# Patient Record
Sex: Female | Born: 1960 | Race: Black or African American | Hispanic: No | Marital: Married | Smoking: Never smoker
Health system: Southern US, Community
[De-identification: ages and names within clinical notes are randomized; demographics above are authoritative.]

## PROBLEM LIST (undated history)

## (undated) DIAGNOSIS — E785 Hyperlipidemia, unspecified: Secondary | ICD-10-CM

## (undated) DIAGNOSIS — I10 Essential (primary) hypertension: Secondary | ICD-10-CM

## (undated) HISTORY — DX: Hyperlipidemia, unspecified: E78.5

## (undated) HISTORY — DX: Essential (primary) hypertension: I10

---

## 2000-06-14 ENCOUNTER — Other Ambulatory Visit: Admission: RE | Admit: 2000-06-14 | Discharge: 2000-06-14 | Payer: Self-pay | Admitting: Obstetrics and Gynecology

## 2001-07-31 ENCOUNTER — Other Ambulatory Visit: Admission: RE | Admit: 2001-07-31 | Discharge: 2001-07-31 | Payer: Self-pay | Admitting: Obstetrics and Gynecology

## 2001-07-31 ENCOUNTER — Encounter: Admission: RE | Admit: 2001-07-31 | Discharge: 2001-07-31 | Payer: Self-pay | Admitting: General Practice

## 2001-07-31 ENCOUNTER — Encounter: Payer: Self-pay | Admitting: General Practice

## 2002-08-06 ENCOUNTER — Other Ambulatory Visit: Admission: RE | Admit: 2002-08-06 | Discharge: 2002-08-06 | Payer: Self-pay | Admitting: Obstetrics and Gynecology

## 2002-12-18 ENCOUNTER — Ambulatory Visit (HOSPITAL_COMMUNITY): Admission: RE | Admit: 2002-12-18 | Discharge: 2002-12-18 | Payer: Self-pay | Admitting: Urology

## 2002-12-18 ENCOUNTER — Encounter: Payer: Self-pay | Admitting: Urology

## 2003-08-19 ENCOUNTER — Other Ambulatory Visit: Admission: RE | Admit: 2003-08-19 | Discharge: 2003-08-19 | Payer: Self-pay | Admitting: Obstetrics and Gynecology

## 2003-08-27 ENCOUNTER — Ambulatory Visit (HOSPITAL_COMMUNITY): Admission: RE | Admit: 2003-08-27 | Discharge: 2003-08-27 | Payer: Self-pay | Admitting: Obstetrics and Gynecology

## 2004-06-15 ENCOUNTER — Ambulatory Visit (HOSPITAL_COMMUNITY): Admission: RE | Admit: 2004-06-15 | Discharge: 2004-06-15 | Payer: Self-pay | Admitting: Obstetrics and Gynecology

## 2004-11-08 ENCOUNTER — Other Ambulatory Visit: Admission: RE | Admit: 2004-11-08 | Discharge: 2004-11-08 | Payer: Self-pay | Admitting: Obstetrics and Gynecology

## 2004-11-09 ENCOUNTER — Ambulatory Visit (HOSPITAL_COMMUNITY): Admission: RE | Admit: 2004-11-09 | Discharge: 2004-11-09 | Payer: Self-pay | Admitting: Obstetrics and Gynecology

## 2005-11-10 ENCOUNTER — Other Ambulatory Visit: Admission: RE | Admit: 2005-11-10 | Discharge: 2005-11-10 | Payer: Self-pay | Admitting: Obstetrics and Gynecology

## 2006-03-20 IMAGING — RF DG HYSTEROGRAM
5 series · 5 of 5 positions shown · non-contrast
Comparison: none

CLINICAL DATA: Assess tubal patency.
HYSTEROGRAM:
Following sterile cleansing of the external cervical os, hysterosalpingography was performed via placement of a 5 French hysterosalpingography catheter into the endocervical canal where is was stabilized with an end catheter balloon.  A normal endometrial morphology is seen.  No extrinsic compression abnormalities from the patient?s known fibroids are seen to suggest a submucosal component.
Both fallopian tubes filled readily and demonstrated a normal appearance.  Bilateral free intraperitoneal spill was identified on both sides. On the post-drainage film no signs of loculation of contrast was seen to suggest the presence of peritubal or periovarian adhesions.

[Series 1: run · 1 of 1 slices shown (1 of 5)]
[im 1/1]
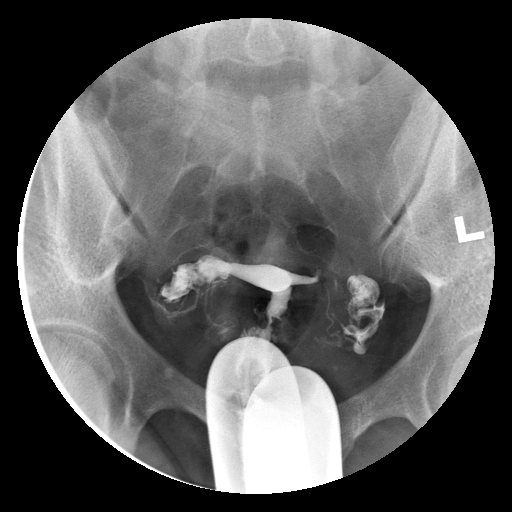

[Series 2: run · 1 of 1 slices shown (2 of 5)]
[im 1/1]
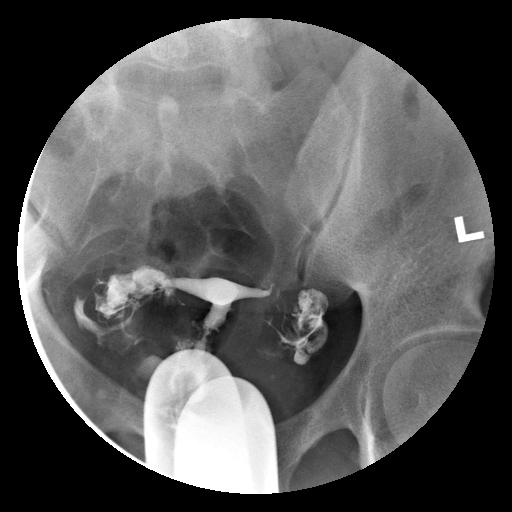

[Series 3: run · 1 of 1 slices shown (3 of 5)]
[im 1/1]
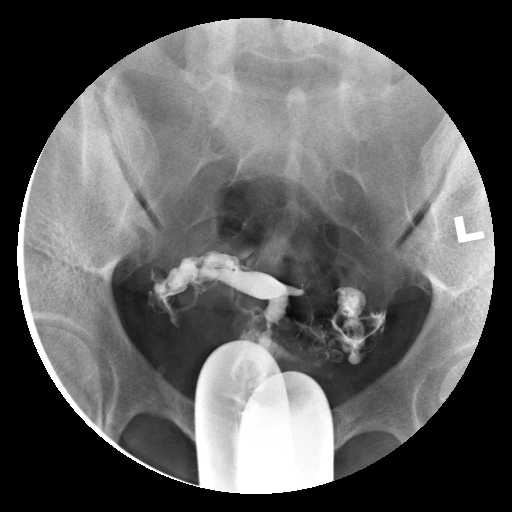

[Series 4: run · 1 of 1 slices shown (4 of 5)]
[im 1/1]
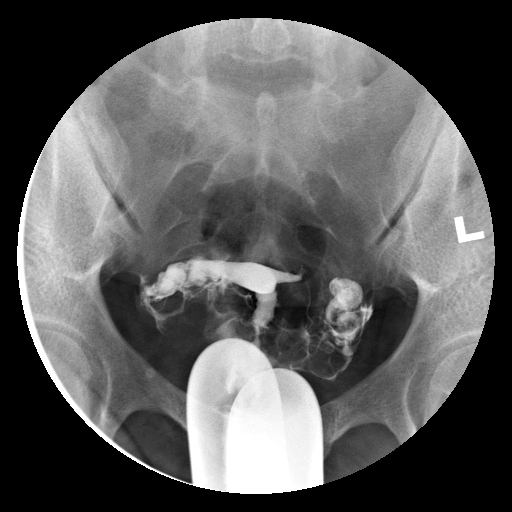

[Series 5: run · 1 of 1 slices shown (5 of 5)]
[im 1/1]
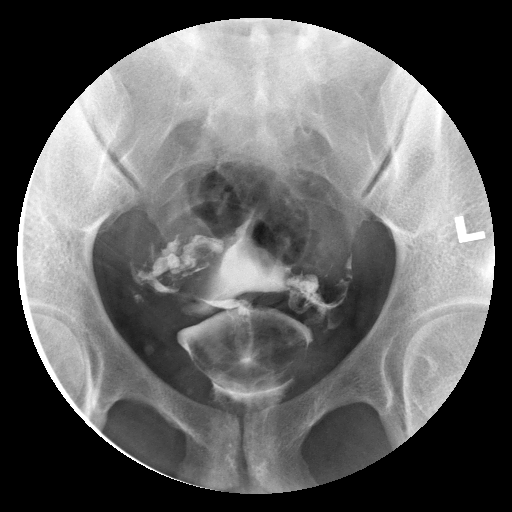

[5 of 5 positions shown; findings below may reference images not displayed]

IMPRESSION: Normal hysterogram.

## 2006-08-16 ENCOUNTER — Ambulatory Visit (HOSPITAL_COMMUNITY): Admission: RE | Admit: 2006-08-16 | Discharge: 2006-08-16 | Payer: Self-pay | Admitting: Obstetrics and Gynecology

## 2006-09-24 ENCOUNTER — Inpatient Hospital Stay (HOSPITAL_COMMUNITY): Admission: AD | Admit: 2006-09-24 | Discharge: 2006-09-25 | Payer: Self-pay | Admitting: Obstetrics and Gynecology

## 2007-05-28 ENCOUNTER — Inpatient Hospital Stay (HOSPITAL_COMMUNITY): Admission: RE | Admit: 2007-05-28 | Discharge: 2007-05-30 | Payer: Self-pay | Admitting: Obstetrics and Gynecology

## 2007-05-28 ENCOUNTER — Encounter (INDEPENDENT_AMBULATORY_CARE_PROVIDER_SITE_OTHER): Payer: Self-pay | Admitting: Obstetrics and Gynecology

## 2007-08-14 ENCOUNTER — Ambulatory Visit (HOSPITAL_COMMUNITY): Admission: RE | Admit: 2007-08-14 | Discharge: 2007-08-14 | Payer: Self-pay | Admitting: Obstetrics and Gynecology

## 2007-08-16 ENCOUNTER — Encounter (INDEPENDENT_AMBULATORY_CARE_PROVIDER_SITE_OTHER): Payer: Self-pay | Admitting: Obstetrics and Gynecology

## 2007-08-16 ENCOUNTER — Ambulatory Visit (HOSPITAL_COMMUNITY): Admission: RE | Admit: 2007-08-16 | Discharge: 2007-08-16 | Payer: Self-pay | Admitting: Obstetrics and Gynecology

## 2008-11-02 ENCOUNTER — Encounter: Admission: RE | Admit: 2008-11-02 | Discharge: 2008-11-02 | Payer: Self-pay | Admitting: Obstetrics and Gynecology

## 2009-11-29 ENCOUNTER — Encounter: Admission: RE | Admit: 2009-11-29 | Discharge: 2009-11-29 | Payer: Self-pay | Admitting: Obstetrics and Gynecology

## 2010-06-05 ENCOUNTER — Encounter: Payer: Self-pay | Admitting: Obstetrics and Gynecology

## 2010-08-26 ENCOUNTER — Ambulatory Visit: Payer: BC Managed Care – PPO | Attending: Orthopedic Surgery | Admitting: Physical Therapy

## 2010-08-26 DIAGNOSIS — R269 Unspecified abnormalities of gait and mobility: Secondary | ICD-10-CM | POA: Insufficient documentation

## 2010-08-26 DIAGNOSIS — IMO0001 Reserved for inherently not codable concepts without codable children: Secondary | ICD-10-CM | POA: Insufficient documentation

## 2010-08-26 DIAGNOSIS — M25569 Pain in unspecified knee: Secondary | ICD-10-CM | POA: Insufficient documentation

## 2010-08-26 DIAGNOSIS — M25669 Stiffness of unspecified knee, not elsewhere classified: Secondary | ICD-10-CM | POA: Insufficient documentation

## 2010-08-26 DIAGNOSIS — M6281 Muscle weakness (generalized): Secondary | ICD-10-CM | POA: Insufficient documentation

## 2010-08-29 ENCOUNTER — Ambulatory Visit: Payer: BC Managed Care – PPO | Admitting: Physical Therapy

## 2010-08-31 ENCOUNTER — Ambulatory Visit: Payer: BC Managed Care – PPO | Admitting: Physical Therapy

## 2010-09-02 ENCOUNTER — Ambulatory Visit: Payer: BC Managed Care – PPO | Admitting: Physical Therapy

## 2010-09-05 ENCOUNTER — Ambulatory Visit: Payer: BC Managed Care – PPO | Admitting: Physical Therapy

## 2010-09-07 ENCOUNTER — Ambulatory Visit: Payer: BC Managed Care – PPO | Admitting: Physical Therapy

## 2010-09-09 ENCOUNTER — Ambulatory Visit: Payer: BC Managed Care – PPO | Admitting: Physical Therapy

## 2010-09-12 ENCOUNTER — Ambulatory Visit: Payer: BC Managed Care – PPO | Admitting: Physical Therapy

## 2010-09-14 ENCOUNTER — Ambulatory Visit: Payer: BC Managed Care – PPO | Attending: Orthopedic Surgery | Admitting: Physical Therapy

## 2010-09-14 DIAGNOSIS — M6281 Muscle weakness (generalized): Secondary | ICD-10-CM | POA: Insufficient documentation

## 2010-09-14 DIAGNOSIS — M25569 Pain in unspecified knee: Secondary | ICD-10-CM | POA: Insufficient documentation

## 2010-09-14 DIAGNOSIS — R269 Unspecified abnormalities of gait and mobility: Secondary | ICD-10-CM | POA: Insufficient documentation

## 2010-09-14 DIAGNOSIS — IMO0001 Reserved for inherently not codable concepts without codable children: Secondary | ICD-10-CM | POA: Insufficient documentation

## 2010-09-14 DIAGNOSIS — M25669 Stiffness of unspecified knee, not elsewhere classified: Secondary | ICD-10-CM | POA: Insufficient documentation

## 2010-09-16 ENCOUNTER — Ambulatory Visit: Payer: BC Managed Care – PPO | Admitting: Physical Therapy

## 2010-09-19 ENCOUNTER — Ambulatory Visit: Payer: BC Managed Care – PPO | Admitting: Physical Therapy

## 2010-09-21 ENCOUNTER — Ambulatory Visit: Payer: BC Managed Care – PPO | Admitting: Physical Therapy

## 2010-09-23 ENCOUNTER — Ambulatory Visit: Payer: BC Managed Care – PPO | Admitting: Physical Therapy

## 2010-09-26 ENCOUNTER — Ambulatory Visit: Payer: BC Managed Care – PPO | Admitting: Physical Therapy

## 2010-09-27 NOTE — H&P (Signed)
NAMESHARLET, Lisa Mccarty      ACCOUNT NO.:  [id number]   MEDICAL RECORD NO.:  [id number]          PATIENT TYPE:  INP   LOCATION:  9304                          FACILITY:  [facility]   PHYSICIAN:  Osborn Coho, M.D.   DATE OF BIRTH:  09-28-1960   DATE OF ADMISSION:  05/28/2007  DATE OF DISCHARGE:                              HISTORY & PHYSICAL   CHIEF COMPLAINT:  Heavy bleeding with fibroids and painful menses.   HISTORY:  Lisa Mccarty is a 50 year old gravida 1, para 1 with her last  menstrual period December 12 with some spotting with complaints of heavy  bleeding with her cycles.  Has recently been improved somewhat on  Loestrin 24 that was started in October 2008.  The patient would like  the fibroids removed with preservation of her uterus.  Over the past  several years, the patient has been trying to conceive and was seen by a  reproductive endocrinologist.  The patient's chances were slim as far as  conception is concerned and as of now is settled on not having any more  children.  The patient, however, wants to retain her uterus and declines  a hysterectomy.   PAST OBSTETRIC HISTORY:  C-section x1.   PAST GYNECOLOGIC HISTORY:  History of regular menses with recent heavy  vaginal bleeding, diagnosed with fibroids.  She denies a history of  gonorrhea or chlamydia.  She does report a history of UTIs.  She has no  history of abnormal Pap smear and her last mammogram was in April 2008,  which was reported as negative.   PAST MEDICAL HISTORY:  Borderline hypertension.   PAST SURGICAL HISTORY:  C-section.   MEDICINES:  Multivitamin p.r.n.   ALLERGIES:  No known drug allergies and no latex allergy.   SOCIAL HISTORY:  She denies cigarette use or drug use and reports  occasional alcohol use and lives with her husband and child.   FAMILY HISTORY:  Denies.   REVIEW OF SYSTEMS:  Denies chest pain or shortness of breath with  activity and denies any recent fevers, chills,  nausea, vomiting, GI or  GU problems.   PHYSICAL EXAMINATION:  HEENT:  Within normal limits.  NECK:  Thyroid not enlarged.  HEART:  Rate and rhythm are regular.  CHEST:  Clear to auscultation bilaterally.  BREASTS:  No masses, discharge, skin change or nipple retraction.  BACK:  No CVA tenderness.  ABDOMEN:  Soft and nontender.  EXTREMITIES:  Within normal limits.  PELVIC:  Genitalia within normal limits.  Cervix nontender.  Uterus  normal size and nontender and no palpable adnexal masses.   LABORATORY DATA:  FSH in 2007 of 12.7.  Endometrial biopsy in September  of 2008 negative for any hyperplasia or atypia.  TSH in 2005 was within  normal limits as well as a prolactin.  The patient had a repeat TSH in  October of 2008, which was within normal limits as well.  Ultrasound in  August of 2008 showed the uterus to measure 8.4 x 7.0 x 5.0 cm with 6  identified fibroids, the largest measuring 2.5 cm.  Bilateral ovaries  were within normal limits.  ASSESSMENT/PLAN:  Lisa Mccarty is a 50 year old para 1 with menorrhagia,  dysmenorrhea and fibroids.  The patient desires removal of her fibroids  and uterine preservation.  The risks, benefits, and alternatives have  been discussed at length with Lisa Mccarty, including but not limited to  bleeding, infection, injury.  The patient declines a hysterectomy.  At  the preop the patient was still deciding whether or not she wanted to  proceed with hysterectomy or myomectomy but decided in the interim that  she had decided to not pursue the hysterectomy and just wanted the  fibroids removed and wanted to preserve her uterus.  Her questions were  answered.  The patient verbalized understanding, and consent signed and  witnessed prior to procedure at the hospital.      Osborn Coho, M.D.  Electronically Signed     AR/MEDQ  D:  05/30/2007  T:  05/30/2007  Job:  [id number]

## 2010-09-27 NOTE — Op Note (Signed)
Lisa Mccarty, Lisa Mccarty      ACCOUNT NO.:  [id number]   MEDICAL RECORD NO.:  [id number]          PATIENT TYPE:  INP   LOCATION:  9304                          FACILITY:  [facility]   PHYSICIAN:  Osborn Coho, M.D.   DATE OF BIRTH:  August 04, 1960   DATE OF PROCEDURE:  05/28/2007  DATE OF DISCHARGE:                               OPERATIVE REPORT   PREOPERATIVE DIAGNOSES:  1. Menorrhagia  2. Dysmenorrhea.  3. Fibroid.   POSTOPERATIVE DIAGNOSES:  1. Menorrhagia  2. Dysmenorrhea.  3. Fibroid.  4. Adhesions.   PROCEDURE:  1. Abdominal myomectomy.  2. Lysis of adhesions.   ATTENDING:  Dr. Osborn Coho   ASSISTANT:  Jaymes Graff, MD   ANESTHESIA:  General.   FINDINGS:  Multiple fibroids, approximately eight removed.   SPECIMENS TO PATHOLOGY:  Fibroids.   FLUIDS:  4000 mL.   URINE OUTPUT:  110 mL.   ESTIMATED BLOOD LOSS:  250 mL.   COMPLICATIONS:  None.   PROCEDURE:  The patient was taken to the operating room after risks,  benefits and alternatives reviewed with the patient.  The patient  verbalized understanding, consent signed and witnessed.  The patient was  placed under general per anesthesia and prepped and draped in normal  sterile fashion.  A Pfannenstiel skin incision was made at the site of  prior scar and carried down to the underlying layer of fascia with the  scalpel and Bovie.  The fascia was excised bilaterally in the midline  and extended bilaterally with the Mayo scissors.  Kocher clamps were  placed on the superior aspect of the fascial incision and the rectus  muscle excised from the fascia.  The same was done on the inferior  aspect of the fascial incision.  The muscle separated in midline and the  midline was noted to be densely scarred which was excised with the Bovie  and Mayo scissors.  The peritoneum was subsequently identified and  tented up and entered with the Metzenbaum scissors.  The peritoneum was  then extended manually.  The bowel was  packed away with two laparotomy  sponges and the Lenox Ahr retractor placed.  The uterus was  elevated with a stitch at the fundus and dilute Pitressin injected over  the right anterior fibroid.  The serosa was then incised and fibroid  shelled out and removed and sent to pathology.  The same was done on  three other fibroids on the anterior wall.  One more for the right  lateral sidewall.  In order to get two of the lower uterine segment  fibroid, the adhesions of the bladder to the anterior uterine wall were  taken down with Metzenbaum scissors and a peanut.  The uterine wall was  held closed with clamps and the posterior wall identified and posterior  wall fibroid noted which was injected with dilute Pitressin and the  serosa incised and fibroid shelled out and sent to pathology.  There was  another one noted somewhat lower down which was removed as well.  There  was one thought to be in the right cornual region and this area was  injected with dilute Pitressin and  incised with no apparent fibroid  noted.  The same was done on the left and the thought was that perhaps  the patient has additionally some adenomyosis.  All incisions were  repaired at their base with 0-0 Vicryl in layers and the serosa was  repaired with 3-0 Vicryl via a running and alternately interlocking  stitch as needed.  Areas that were noted to be remaining bleeding were  stitched with 3-0 Vicryl via a figure-of-eight stitch and one using 0-0  Vicryl.  There was minimal oozing noted and good hemostasis was noted.  During the surgery some adhesions were ligated using the Metzenbaum  scissors from the right sidewall of the uterus from the ovary and bowel.  Intercede was placed on the posterior wall of the uterus and the uterus  was returned to its anatomical position and laparotomy sponges removed.  There was some bleeding noted at the stitch on the fundus which was used  to elevate the uterus which was  repaired with 3-0 Vicryl via a running  stitch.  Good hemostasis was noted.  Intercede was also placed on the  anterior wall of the uterus.  The O'Connor-O'Sullivan retractor was  removed.  The peritoneum was repaired with 2-0 chromic in a running  fashion.  The fascia was repaired with 0-0 Vicryl in a running fashion.  The subcutaneous tissue was irrigated and made hemostatic with the  Bovie.  Three interrupted stitches of 2-0 plain were used in the  subcutaneous tissue and the skin was reapproximated using 3-0 Monocryl  via a subcuticular stitch.  Sponge, lap and needle count was correct.  The patient tolerated procedure well and was returned to recovery room  in good condition.      Osborn Coho, M.D.  Electronically Signed     AR/MEDQ  D:  05/30/2007  T:  05/30/2007  Job:  [id number]

## 2010-09-27 NOTE — Op Note (Signed)
NAME:  Lisa Mccarty, Lisa Mccarty      ACCOUNT NO.:  [id number]   MEDICAL RECORD NO.:  [id number]          PATIENT TYPE:  AMB   LOCATION:  SDC                           FACILITY:  [facility]   PHYSICIAN:  Osborn Coho, M.D.   DATE OF BIRTH:  1960-08-19   DATE OF PROCEDURE:  08/16/2007  DATE OF DISCHARGE:                               OPERATIVE REPORT   PREOPERATIVE DIAGNOSES:  1. Menorrhagia.  2. Desires permanent sterilization.   POSTOPERATIVE DIAGNOSES:  1. Menorrhagia.  2. Desires permanent sterilization.  3. Multiple adhesions.   PROCEDURE:  1. Laparoscopic bilateral tubal ligation.  2. Chromopertubation.  3. Hysteroscopy.  4. Dilation and curettage.  5. Lysis of adhesions.  6. NovaSure endometrial ablation under ultrasound guidance.   SURGEON:  Osborn Coho, M.D.   ANESTHESIA:  General.   FINDINGS:  Multiple adhesions of bowel to the fundus and posterior wall  of the uterus and adhesions of the omentum to the anterior abdominal  wall.   SPECIMENS TO PATHOLOGY:  Endometrial curettings.   FINDINGS:  Uterus sounded to 7.5 cm.  Cervical length with 3.5 cm.  Cavity length was 4.0 cm.  Cavity width was 2.7 cm.  Power was 59 watts  and time of ablation was 1 minute and 50 seconds.  Additionally, the  measurements from the endometrium while the NovaSure instrument guided  under ultrasound guidance was 1.5 cm from the instrument to the serosa  at the fundus anteriorly and posteriorly.  Blocked left fallopian tube  and patent right fallopian tube.   FLUIDS:  Approximately 1000 mL.   URINE OUTPUT:  Quantity sufficient via straight catheter prior to  procedure.  Hysteroscopic fluid deficit of LR 30 mL.   COMPLICATIONS:  None.   PROCEDURE:  The patient was taken to the operating room after the risks,  benefits and alternatives were discussed with the patient.  The patient  verbalized understanding and consent was signed and witnessed.  The  patient was placed under general  anesthesia and prepped and draped in  normal sterile fashion in the dorsal lithotomy position.  A bivalve  speculum was placed in the patient's vagina and the anterior lip of  cervix grasped with a single-tooth tenaculum.  The acorn manipulator was  placed into the cervix and cavity and attention was then turned to the  abdomen after gowning and regloving.  A 10-mm incision was made at the  umbilicus and carried down to the underlying layer of fascia with the  Mayo scissors and the peritoneum was entered bluntly.  A 0 Vicryl  pursestring stitch was placed in the fascia and the Hasson was placed as  well.  Laparoscope was introduced and Fitz-Hugh and Lyda Jester noticed as  well as multiple adhesions as mentioned above in the findings.  In order  to identify the fimbria, I had to take advantage of the  chromopertubation.  The adhesions were removed after attempting to place  a 5-mm incision in the suprapubic region after injecting with dilute  Marcaine.  The adhesions of the omentum were directly beneath this  incision and therefore trocar was not introduced.   A 5-mm incision was then made  in the right lower quadrant after  injecting 0.25% Marcaine and 5 mm trocar advanced under direct  visualization without difficulty.  The adhesions were then removed  bluntly.  There was some bleeding noted on the serosal surface of the  uterus which was cauterized with the bipolar Kleppinger.  The fimbria  was identified bilaterally and ovaries appeared to be within normal  limits.  Chromopertubation was performed and there was no leakage from  the uterus and the right fallopian tube was the only tube that had free  spill.  There was no free spill of the left fallopian tube.  Copious  irrigation was then performed and good hemostasis noted.  Sterilization  procedure was then performed using the bipolar Kleppinger in the isthmic  portion on the right fallopian tube for at least three consecutive  burns.   The same was done contralaterally.  This was done while the  patient was in Trendelenburg and there was good hemostasis of all sites.  The pneumoperitoneum was then relieved after moving right lower quadrant  5-mm trocar.  The Roseanne Reno was removed as well as the scope.  The 0 Vicryl  placed in the fascia at the umbilicus was then tied and the subcutaneous  tissue was reapproximated using 3-0 Monocryl.  The skin was  reapproximated using 3-0 Monocryl via a subcuticular stitch.  The  suprapubic 5-mm incision and the right lower quadrant 5-mm incisions  were repaired with Dermabond.   Attention was then turned to the perineum where the uterus was then  irrigated with 120 mL of normal saline until clear.  Chromopertubation  was performed with dilute indigo carmine.  The acorn was then removed  and the cervix dilated for passage of the hysteroscope.  Hysteroscope  was introduced and no obvious intracavitary lesions noted.  Curettage  was performed and specimen sent to pathology.  The ultrasound tech was  then called and under ultrasound guidance the NovaSure instrument was  introduced into the intrauterine cavity after dilating the cervix  further.  The measurements from the endometrium were noted as above and  with the settings at 4.0 cm for the cavity length and 2.7 cm for the  cavity width, ablation was performed without difficulty for one minute  and 50 seconds.  The NovaSure instrument was then removed and the  hysteroscope was introduced once again and good results for the ablation  were noted.  Hysteroscope was then removed and paracervical block  administered using a total of 10 mL of 1% lidocaine.  The tenaculum was  removed and good hemostasis of tenaculum sites were noted.  Speculum was  removed as well.  Sponge, lap and needle count was correct.   The patient tolerated the procedure well and upon cleaning the patient,  it was noted that the suprapubic incision had come open after  the  ultrasound guidance was performed and this area was once again prepped  with Betadine and the  skin reapproximated using 3-0 Monocryl via a subcuticular stitch and  Dermabond applied.  Dermabond was also applied at the umbilicus.  Sponge, lap and needle count was correct.  The patient the tolerated  procedure well and was returned to the recovery room in good condition.      Osborn Coho, M.D.  Electronically Signed     AR/MEDQ  D:  08/16/2007  T:  08/16/2007  Job:  [id number]

## 2010-09-28 ENCOUNTER — Ambulatory Visit: Payer: BC Managed Care – PPO | Admitting: Physical Therapy

## 2010-09-30 ENCOUNTER — Ambulatory Visit: Payer: BC Managed Care – PPO | Admitting: Physical Therapy

## 2010-09-30 NOTE — Discharge Summary (Signed)
NAMECORRIN, Mccarty      ACCOUNT NO.:  [id number]   MEDICAL RECORD NO.:  [id number]          PATIENT TYPE:  INP   LOCATION:  9304                          FACILITY:  [facility]   PHYSICIAN:  Osborn Coho, M.D.   DATE OF BIRTH:  [birth year]   DATE OF ADMISSION:  05/28/2007  DATE OF DISCHARGE:  05/30/2007                               DISCHARGE SUMMARY   DISCHARGE DIAGNOSES:  Symptomatic uterine fibroids, menorrhagia and  dysmenorrhea.   OPERATION ON THE DATE OF ADMISSION:  The patient underwent abdominal  myomectomy with lysis of adhesions tolerating procedure was well.  The  patient had eight fibroids removed.   HISTORY OF PRESENT ILLNESS:  [name] is a 50 year old female  gravida 1, para 1 who presents for abdominal myomectomy because of  symptomatic uterine fibroids.  Please see the patient's dictated history  and physical examination for details.   PREOPERATIVE PHYSICAL EXAM:  GENERAL EXAM:  Normal.  PELVIC EXAM:  Genitalia within normal limits.  Cervix nontender.  Uterus  appears normal size and nontender with no palpable adnexal masses.   HOSPITAL COURSE:  On the day of admission, the patient underwent  aforementioned procedures tolerating them all well.  The patient's  postoperative course was unremarkable with patient resuming bowel and  bladder function by postop day #2, and therefore deemed ready for  discharge home.  The patient's postoperative hemoglobin was 10.1  (preoperative hemoglobin 13.1).  In spite of the patient's anemia at  discharge, she was asymptomatic.   DISCHARGE MEDICATIONS:  1. Colace 100 mg twice daily until bowel movements are regular.  2. Percocet 5/325 1-2 tablets every 4 hours as needed for pain.  3. Ibuprofen 600 mg with food every 6 hours for 3 days then as needed      for pain.   FOLLOW UP:  The patient is to call [facility] for a 6 weeks  postoperative visit with Dr. Su Hilt.   DISCHARGE INSTRUCTIONS:  The patient  was given a copy of [facility] postoperative instruction sheet.  She was further  advised to avoid driving for 2 weeks or while she is taking narcotic  pain medications.  No lifting for 4 weeks anything greater than 10  pounds.  No intercourse for 6 weeks.  She may shower.  She may walk up  steps.  She should increase her activities slowly.  The patient's diet  is without restriction though she was encouraged to increase her protein  and vitamin C intake.   FINAL PATHOLOGY:  Peritoneal:  Fibro connective tissue with extensive  fibrosis and dystrophic calcification no evidence of malignancy.  Uterine fibroids, excision:  Leiomyomata.  The aggregate weight of her  fibroids was 14 grams.      Lisa Mccarty.      Osborn Coho, M.D.  Electronically Signed    EJP/MEDQ  D:  06/11/2007  T:  06/12/2007  Job:  [id number]

## 2010-10-03 ENCOUNTER — Ambulatory Visit: Payer: BC Managed Care – PPO | Admitting: Physical Therapy

## 2010-10-05 ENCOUNTER — Ambulatory Visit: Payer: BC Managed Care – PPO | Admitting: Physical Therapy

## 2010-10-07 ENCOUNTER — Ambulatory Visit: Payer: BC Managed Care – PPO | Admitting: Physical Therapy

## 2010-10-12 ENCOUNTER — Ambulatory Visit: Payer: BC Managed Care – PPO | Admitting: Physical Therapy

## 2010-10-14 ENCOUNTER — Ambulatory Visit: Payer: BC Managed Care – PPO | Attending: Orthopedic Surgery | Admitting: Physical Therapy

## 2010-10-14 DIAGNOSIS — IMO0001 Reserved for inherently not codable concepts without codable children: Secondary | ICD-10-CM | POA: Insufficient documentation

## 2010-10-14 DIAGNOSIS — M25569 Pain in unspecified knee: Secondary | ICD-10-CM | POA: Insufficient documentation

## 2010-10-14 DIAGNOSIS — R269 Unspecified abnormalities of gait and mobility: Secondary | ICD-10-CM | POA: Insufficient documentation

## 2010-10-14 DIAGNOSIS — M25669 Stiffness of unspecified knee, not elsewhere classified: Secondary | ICD-10-CM | POA: Insufficient documentation

## 2010-10-14 DIAGNOSIS — M6281 Muscle weakness (generalized): Secondary | ICD-10-CM | POA: Insufficient documentation

## 2010-10-18 ENCOUNTER — Encounter: Payer: BC Managed Care – PPO | Admitting: Physical Therapy

## 2010-10-19 ENCOUNTER — Ambulatory Visit: Payer: BC Managed Care – PPO | Admitting: Physical Therapy

## 2010-10-21 ENCOUNTER — Ambulatory Visit: Payer: BC Managed Care – PPO

## 2010-10-24 ENCOUNTER — Ambulatory Visit: Payer: BC Managed Care – PPO

## 2010-10-25 ENCOUNTER — Encounter: Payer: BC Managed Care – PPO | Admitting: Physical Therapy

## 2010-10-27 ENCOUNTER — Ambulatory Visit: Payer: BC Managed Care – PPO | Admitting: Physical Therapy

## 2010-10-28 ENCOUNTER — Ambulatory Visit: Payer: BC Managed Care – PPO | Admitting: Physical Therapy

## 2010-11-01 ENCOUNTER — Ambulatory Visit: Payer: BC Managed Care – PPO | Admitting: Physical Therapy

## 2010-11-02 ENCOUNTER — Other Ambulatory Visit: Payer: Self-pay | Admitting: Obstetrics and Gynecology

## 2010-11-02 DIAGNOSIS — Z1231 Encounter for screening mammogram for malignant neoplasm of breast: Secondary | ICD-10-CM

## 2010-11-04 ENCOUNTER — Ambulatory Visit: Payer: BC Managed Care – PPO | Admitting: Physical Therapy

## 2010-11-08 ENCOUNTER — Ambulatory Visit: Payer: BC Managed Care – PPO | Admitting: Physical Therapy

## 2010-11-11 ENCOUNTER — Ambulatory Visit: Payer: BC Managed Care – PPO | Admitting: Physical Therapy

## 2010-11-15 ENCOUNTER — Ambulatory Visit: Payer: BC Managed Care – PPO | Attending: Orthopedic Surgery | Admitting: Physical Therapy

## 2010-11-15 DIAGNOSIS — IMO0001 Reserved for inherently not codable concepts without codable children: Secondary | ICD-10-CM | POA: Insufficient documentation

## 2010-11-15 DIAGNOSIS — M25669 Stiffness of unspecified knee, not elsewhere classified: Secondary | ICD-10-CM | POA: Insufficient documentation

## 2010-11-15 DIAGNOSIS — M25569 Pain in unspecified knee: Secondary | ICD-10-CM | POA: Insufficient documentation

## 2010-11-15 DIAGNOSIS — R269 Unspecified abnormalities of gait and mobility: Secondary | ICD-10-CM | POA: Insufficient documentation

## 2010-11-15 DIAGNOSIS — M6281 Muscle weakness (generalized): Secondary | ICD-10-CM | POA: Insufficient documentation

## 2010-11-18 ENCOUNTER — Ambulatory Visit: Payer: BC Managed Care – PPO

## 2010-11-22 ENCOUNTER — Ambulatory Visit: Payer: BC Managed Care – PPO | Admitting: Physical Therapy

## 2010-11-25 ENCOUNTER — Ambulatory Visit: Payer: BC Managed Care – PPO | Admitting: Physical Therapy

## 2010-11-29 ENCOUNTER — Ambulatory Visit: Payer: BC Managed Care – PPO | Admitting: Physical Therapy

## 2010-11-30 ENCOUNTER — Other Ambulatory Visit: Payer: Self-pay | Admitting: Orthopedic Surgery

## 2010-11-30 DIAGNOSIS — M545 Low back pain, unspecified: Secondary | ICD-10-CM

## 2010-12-03 ENCOUNTER — Other Ambulatory Visit: Payer: BC Managed Care – PPO

## 2010-12-06 ENCOUNTER — Ambulatory Visit
Admission: RE | Admit: 2010-12-06 | Discharge: 2010-12-06 | Disposition: A | Discharge status: Home / Self Care | Payer: BC Managed Care – PPO | Source: Ambulatory Visit | Attending: Obstetrics and Gynecology | Admitting: Obstetrics and Gynecology

## 2010-12-06 DIAGNOSIS — Z1231 Encounter for screening mammogram for malignant neoplasm of breast: Secondary | ICD-10-CM

## 2010-12-17 ENCOUNTER — Other Ambulatory Visit: Payer: BC Managed Care – PPO

## 2011-02-02 LAB — CBC
Hemoglobin: 11.2 — ABNORMAL LOW
MCHC: 34.5
MCHC: 34.7
MCHC: 34.8
MCV: 88.5
MCV: 89.8
Platelets: 271
Platelets: 211
RBC: 4.3
RBC: 3.25 — ABNORMAL LOW
RDW: 13.9
RDW: 13.9
WBC: 8.2

## 2011-02-02 LAB — BASIC METABOLIC PANEL
BUN: 5 — ABNORMAL LOW
Calcium: 8.3 — ABNORMAL LOW
Creatinine, Ser: 0.82
GFR calc Af Amer: >60
GFR calc non Af Amer: >60

## 2011-02-02 LAB — HCG, SERUM, QUALITATIVE: Preg, Serum: NEGATIVE

## 2011-02-07 LAB — CBC
MCV: 88.9
Platelets: 255
RBC: 3.99
WBC: 4.8

## 2011-02-07 LAB — HCG, SERUM, QUALITATIVE: Preg, Serum: NEGATIVE

## 2011-05-24 ENCOUNTER — Ambulatory Visit: Payer: BC Managed Care – PPO | Attending: Orthopedic Surgery | Admitting: Physical Therapy

## 2011-05-24 DIAGNOSIS — IMO0001 Reserved for inherently not codable concepts without codable children: Secondary | ICD-10-CM | POA: Insufficient documentation

## 2011-05-24 DIAGNOSIS — M2560 Stiffness of unspecified joint, not elsewhere classified: Secondary | ICD-10-CM | POA: Insufficient documentation

## 2011-05-24 DIAGNOSIS — M6281 Muscle weakness (generalized): Secondary | ICD-10-CM | POA: Insufficient documentation

## 2011-05-25 ENCOUNTER — Ambulatory Visit: Payer: BC Managed Care – PPO | Admitting: Physical Therapy

## 2011-05-26 ENCOUNTER — Encounter: Payer: BC Managed Care – PPO | Admitting: Physical Therapy

## 2011-05-29 ENCOUNTER — Ambulatory Visit: Payer: BC Managed Care – PPO | Admitting: Physical Therapy

## 2011-05-31 ENCOUNTER — Ambulatory Visit: Payer: BC Managed Care – PPO | Admitting: Physical Therapy

## 2011-06-06 ENCOUNTER — Ambulatory Visit: Payer: BC Managed Care – PPO | Admitting: Physical Therapy

## 2011-06-08 ENCOUNTER — Ambulatory Visit: Payer: BC Managed Care – PPO | Admitting: Physical Therapy

## 2011-06-13 ENCOUNTER — Ambulatory Visit: Payer: BC Managed Care – PPO | Admitting: Physical Therapy

## 2011-06-15 ENCOUNTER — Ambulatory Visit: Payer: BC Managed Care – PPO | Admitting: Physical Therapy

## 2011-06-20 ENCOUNTER — Encounter: Payer: BC Managed Care – PPO | Admitting: Physical Therapy

## 2011-06-21 ENCOUNTER — Ambulatory Visit: Payer: BC Managed Care – PPO | Attending: Orthopedic Surgery | Admitting: Physical Therapy

## 2011-06-21 DIAGNOSIS — M2560 Stiffness of unspecified joint, not elsewhere classified: Secondary | ICD-10-CM | POA: Insufficient documentation

## 2011-06-21 DIAGNOSIS — IMO0001 Reserved for inherently not codable concepts without codable children: Secondary | ICD-10-CM | POA: Insufficient documentation

## 2011-06-21 DIAGNOSIS — M6281 Muscle weakness (generalized): Secondary | ICD-10-CM | POA: Insufficient documentation

## 2011-06-22 ENCOUNTER — Encounter: Payer: BC Managed Care – PPO | Admitting: Physical Therapy

## 2011-08-09 ENCOUNTER — Other Ambulatory Visit: Payer: Self-pay | Admitting: Sports Medicine

## 2011-08-09 ENCOUNTER — Ambulatory Visit
Admission: RE | Admit: 2011-08-09 | Discharge: 2011-08-09 | Disposition: A | Discharge status: Home / Self Care | Payer: BC Managed Care – PPO | Source: Ambulatory Visit | Attending: Sports Medicine | Admitting: Sports Medicine

## 2011-08-09 DIAGNOSIS — M25561 Pain in right knee: Secondary | ICD-10-CM

## 2011-08-09 DIAGNOSIS — M25562 Pain in left knee: Secondary | ICD-10-CM

## 2012-06-04 ENCOUNTER — Ambulatory Visit: Payer: BC Managed Care – PPO | Admitting: Obstetrics and Gynecology

## 2012-09-09 ENCOUNTER — Other Ambulatory Visit: Payer: Self-pay | Admitting: Obstetrics and Gynecology

## 2012-09-09 DIAGNOSIS — Z1231 Encounter for screening mammogram for malignant neoplasm of breast: Secondary | ICD-10-CM

## 2012-09-10 ENCOUNTER — Ambulatory Visit (INDEPENDENT_AMBULATORY_CARE_PROVIDER_SITE_OTHER): Payer: BC Managed Care – PPO

## 2012-09-10 DIAGNOSIS — Z1231 Encounter for screening mammogram for malignant neoplasm of breast: Secondary | ICD-10-CM

## 2013-05-13 IMAGING — CR DG KNEE 1-2V*L*
2 series · 2 of 2 positions shown · non-contrast
Comparison: Right knee performed today.

CLINICAL DATA: Chronic bilateral knee pain.

LEFT KNEE - 1-2 VIEW

[view not recorded (1 of 2)]
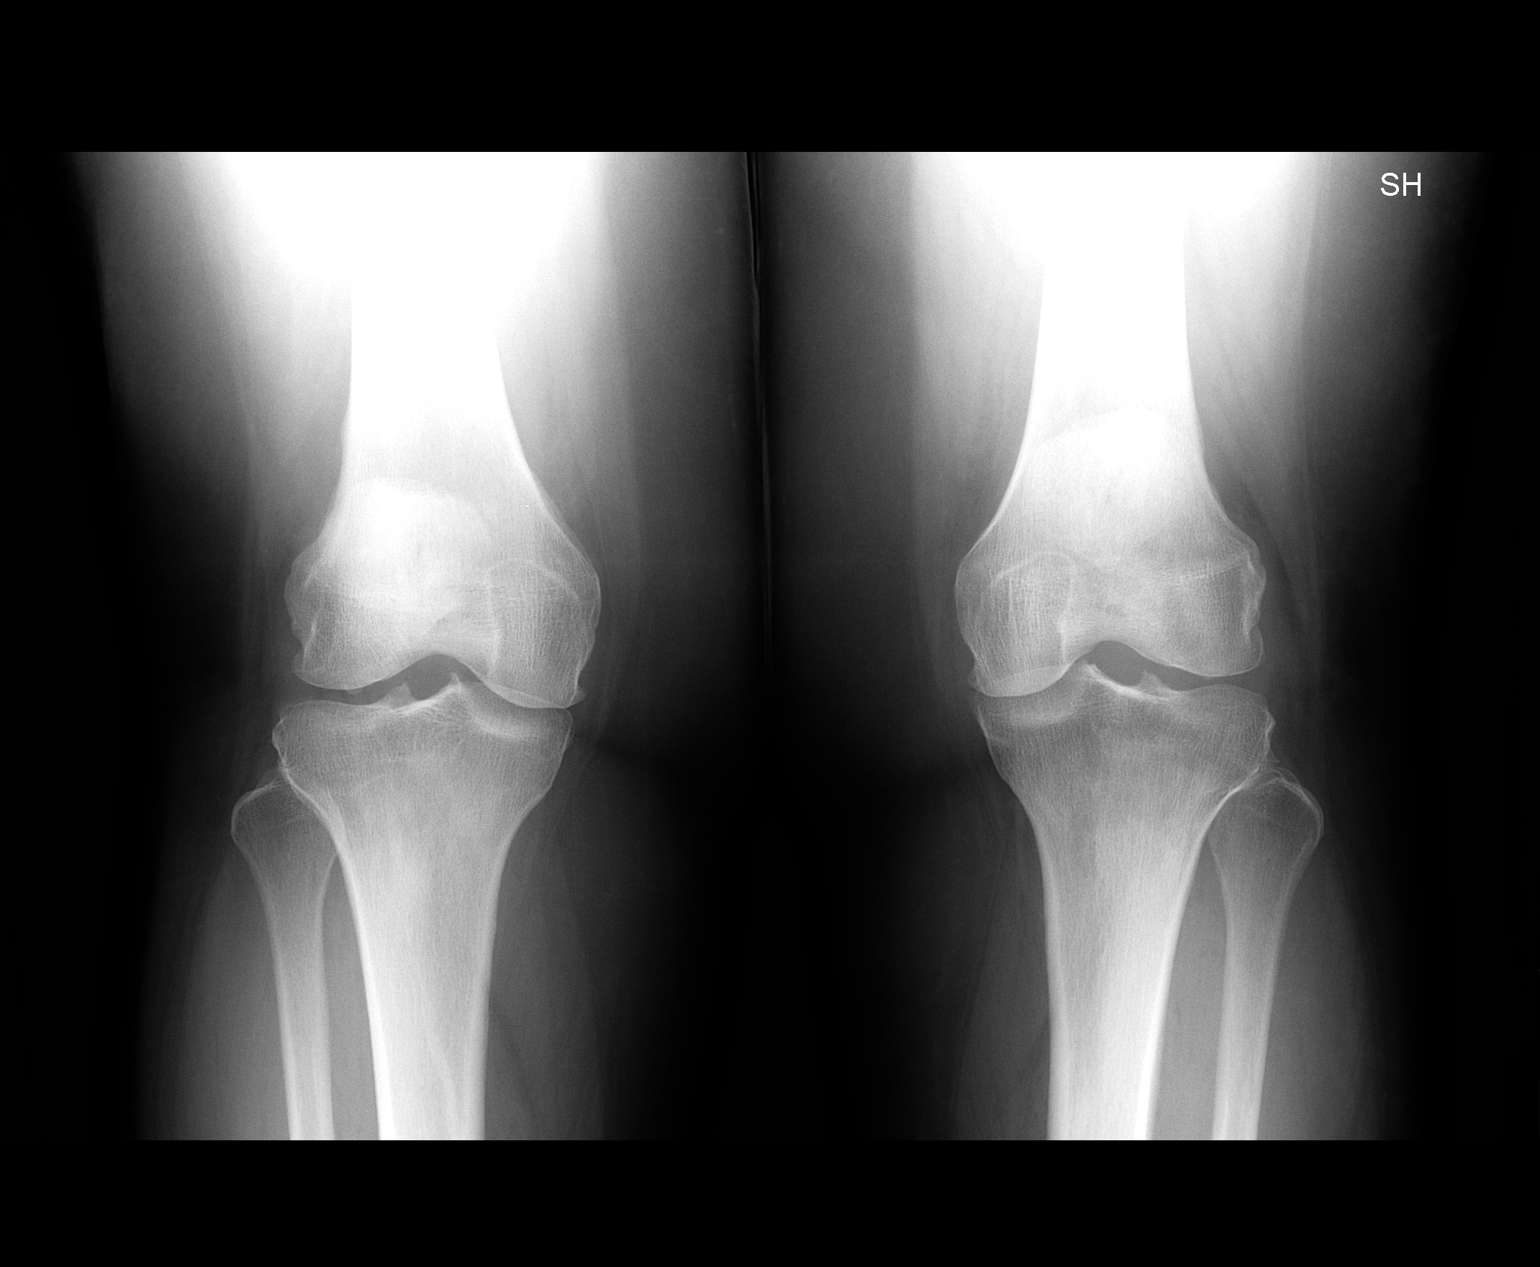

[view not recorded (2 of 2)]
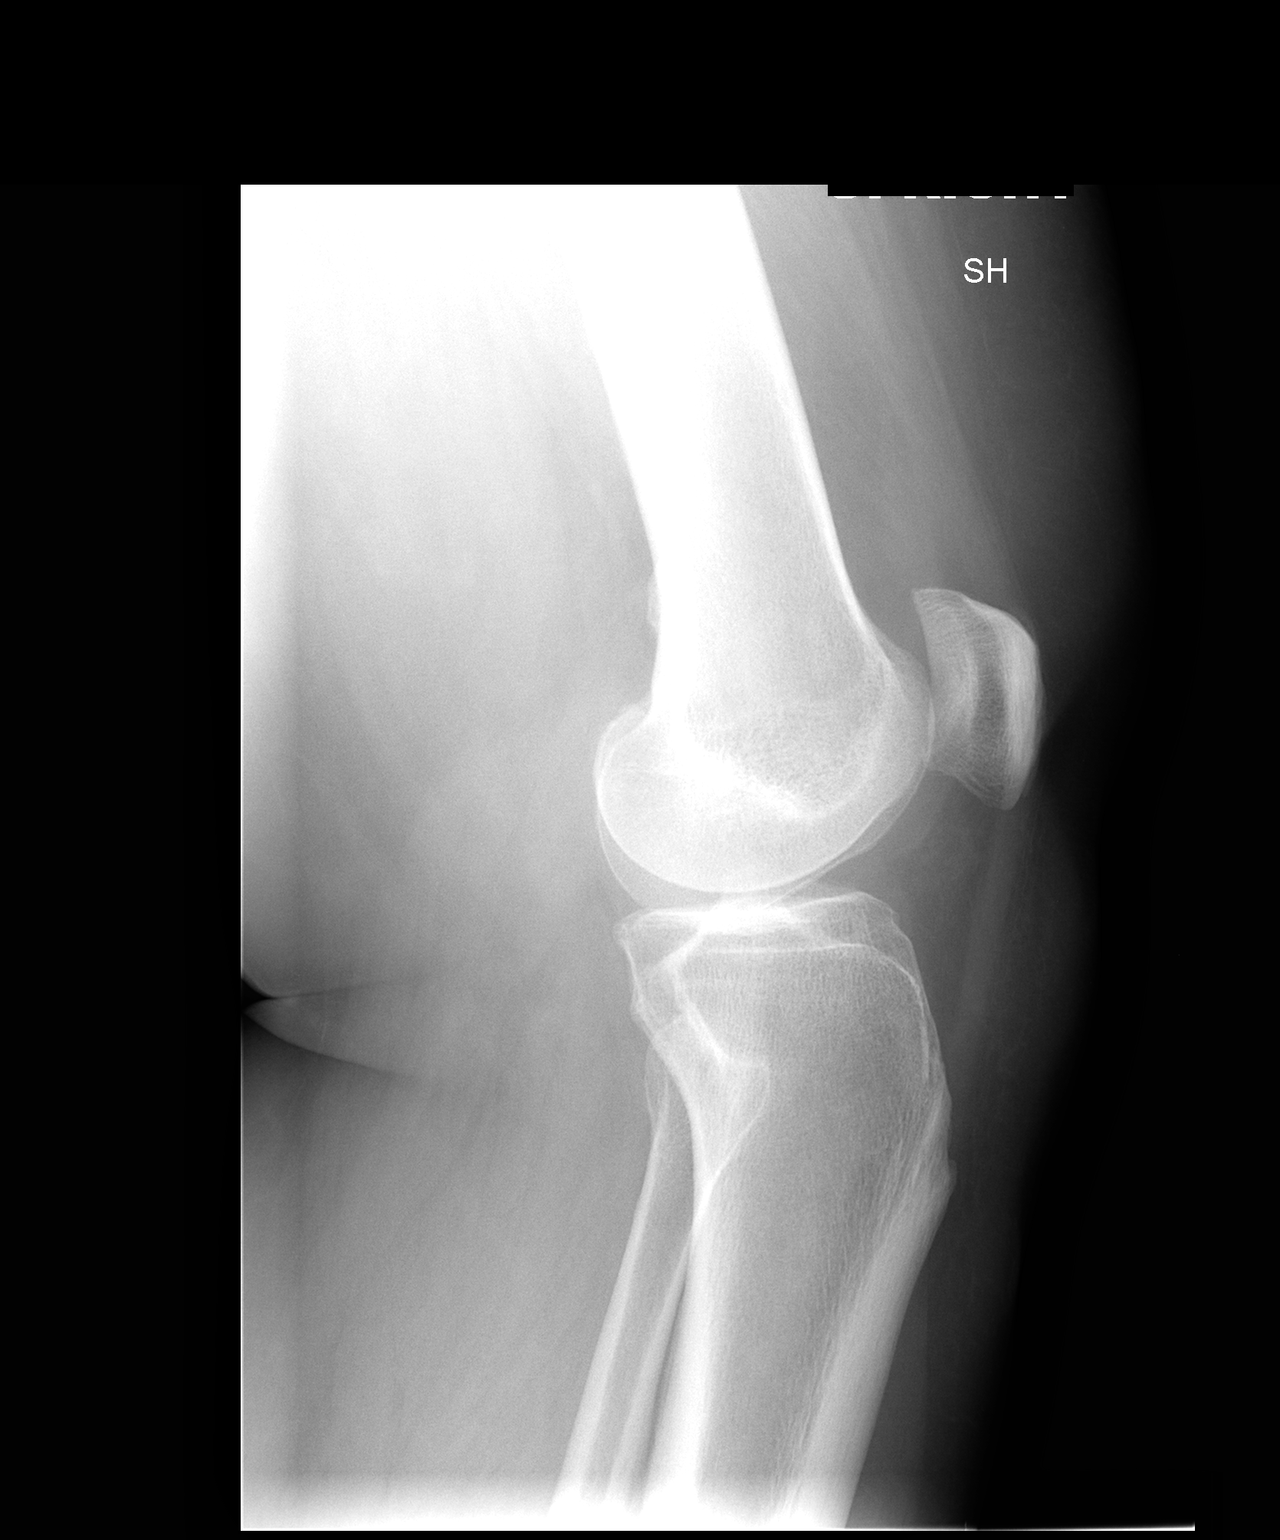

[2 of 2 positions shown; findings below may reference images not displayed]

FINDINGS: Mild joint space narrowing and spurring, best seen in the
medial compartment. No acute bony abnormality.  Specifically, no
fracture, subluxation, or dislocation.  Soft tissues are intact.
No joint effusion.
IMPRESSION: No acute bony abnormality.  Early degenerative changes.

## 2013-05-13 IMAGING — CR DG KNEE 1-2V*L*
1 series · 1 of 1 positions shown · non-contrast
Comparison: Right knee performed today.

CLINICAL DATA: Chronic bilateral knee pain.

LEFT KNEE - 1-2 VIEW

[view not recorded]
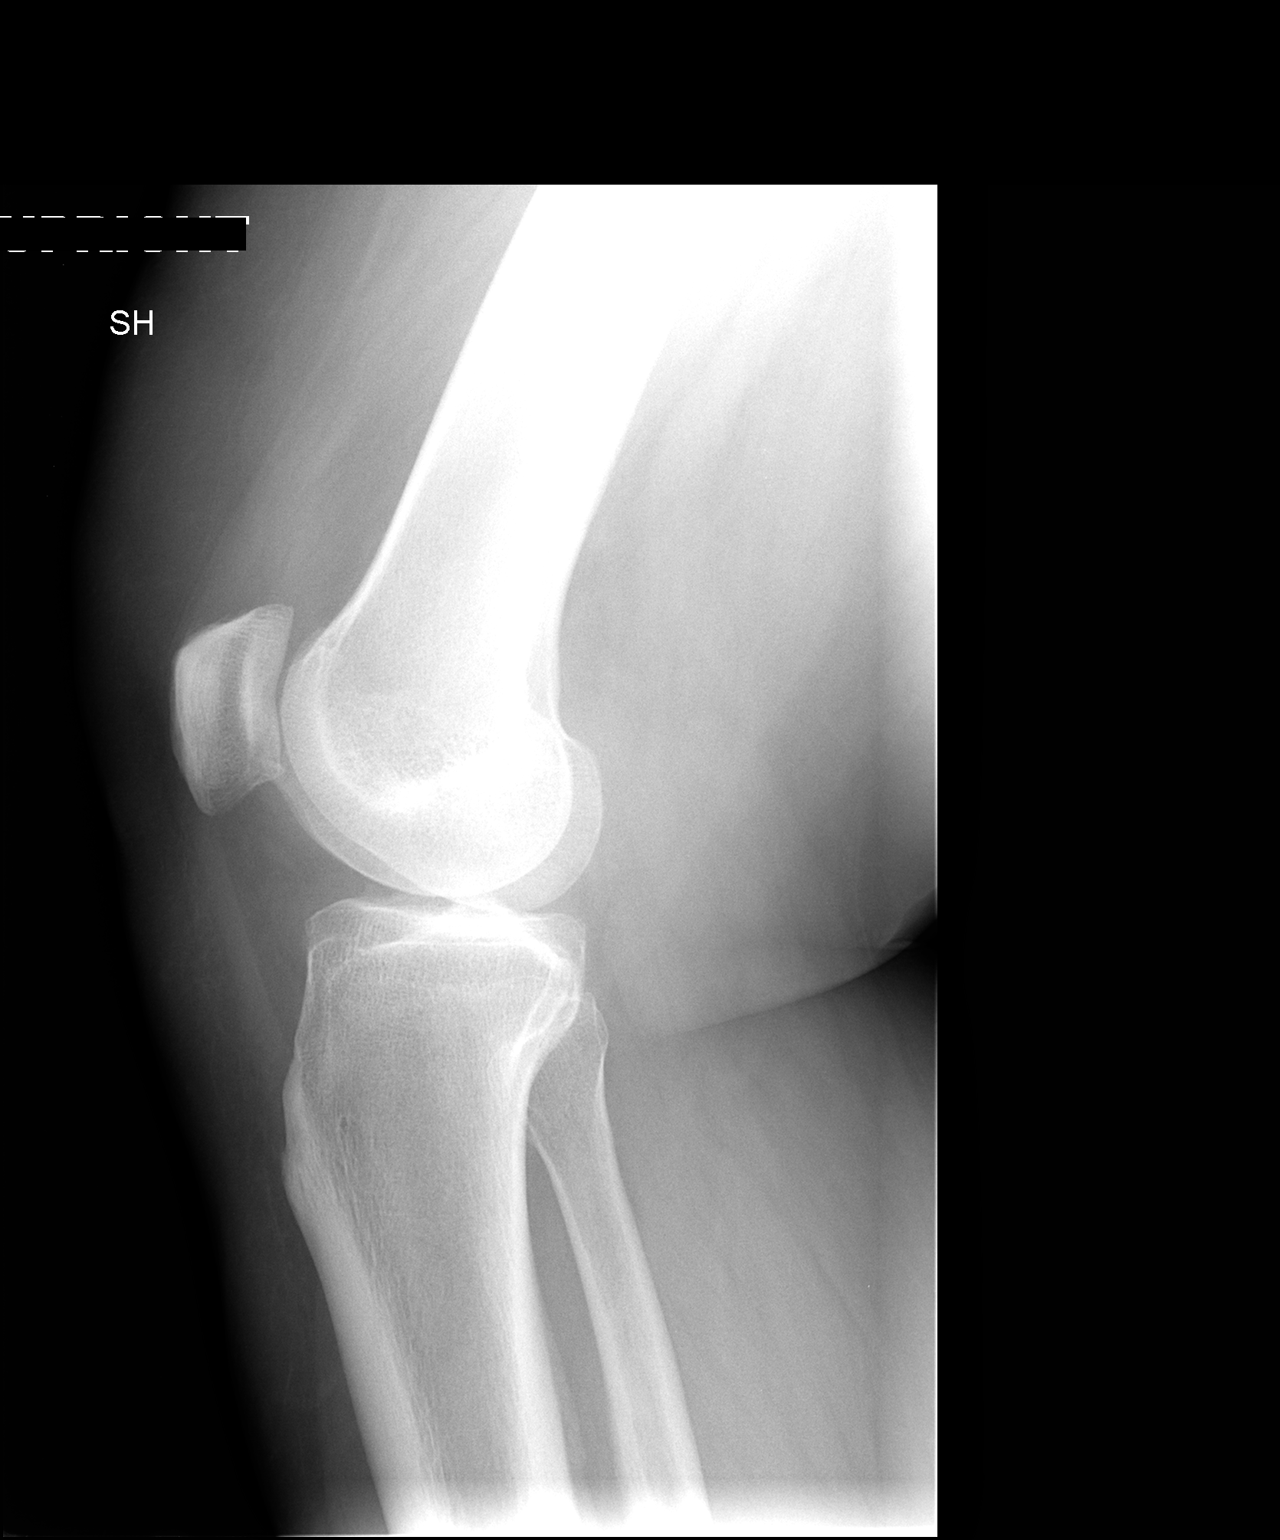

[1 of 1 positions shown; findings below may reference images not displayed]

FINDINGS: Mild joint space narrowing and spurring, best seen in the
medial compartment. No acute bony abnormality.  Specifically, no
fracture, subluxation, or dislocation.  Soft tissues are intact.
No joint effusion.
IMPRESSION: No acute bony abnormality.  Early degenerative changes.

## 2013-11-26 ENCOUNTER — Other Ambulatory Visit: Payer: Self-pay | Admitting: Obstetrics and Gynecology

## 2013-11-26 DIAGNOSIS — Z1231 Encounter for screening mammogram for malignant neoplasm of breast: Secondary | ICD-10-CM

## 2013-12-16 ENCOUNTER — Ambulatory Visit (INDEPENDENT_AMBULATORY_CARE_PROVIDER_SITE_OTHER): Payer: BC Managed Care – PPO

## 2013-12-16 DIAGNOSIS — Z1231 Encounter for screening mammogram for malignant neoplasm of breast: Secondary | ICD-10-CM

## 2015-01-01 ENCOUNTER — Other Ambulatory Visit: Payer: Self-pay | Admitting: Obstetrics and Gynecology

## 2015-01-01 DIAGNOSIS — Z1231 Encounter for screening mammogram for malignant neoplasm of breast: Secondary | ICD-10-CM

## 2015-01-27 ENCOUNTER — Ambulatory Visit (INDEPENDENT_AMBULATORY_CARE_PROVIDER_SITE_OTHER): Payer: BC Managed Care – PPO

## 2015-01-27 ENCOUNTER — Ambulatory Visit: Payer: BC Managed Care – PPO

## 2015-01-27 DIAGNOSIS — Z1231 Encounter for screening mammogram for malignant neoplasm of breast: Secondary | ICD-10-CM | POA: Diagnosis not present

## 2016-06-05 ENCOUNTER — Ambulatory Visit (INDEPENDENT_AMBULATORY_CARE_PROVIDER_SITE_OTHER): Payer: BC Managed Care – PPO | Admitting: Rehabilitative and Restorative Service Providers"

## 2016-06-05 ENCOUNTER — Encounter: Payer: Self-pay | Admitting: Rehabilitative and Restorative Service Providers"

## 2016-06-05 DIAGNOSIS — R29898 Other symptoms and signs involving the musculoskeletal system: Secondary | ICD-10-CM | POA: Diagnosis not present

## 2016-06-05 DIAGNOSIS — M25642 Stiffness of left hand, not elsewhere classified: Secondary | ICD-10-CM | POA: Diagnosis not present

## 2016-06-05 DIAGNOSIS — R531 Weakness: Secondary | ICD-10-CM | POA: Diagnosis not present

## 2016-06-05 NOTE — Patient Instructions (Addendum)
AROM: Thumb Flexion / Extension    Actively bend right thumb across palm as far as possible. Hold _10___ seconds. Relax. Then pull thumb back into hitchhike position. Repeat __10__ times per set. Do _1-2___ sets per session. Do ___2-3_ sessions per day.  PROM: Thumb DIP Joint    Passively bend thumb of right hand at knuckle as shown until stretch is felt. Hold __10-15__ seconds. Relax. Straighten thumb as far as possible. Repeat __5-10__ times per set. Do _1-2___ sets per session. Do __2-3__ sessions per day.   Finger Opposition    Actively touch right thumb to each fingertip. Start with index finger and proceed toward little finger. Move slowly at first, then more rapidly as motion and coordination improve. Be sure to touch each fingertip. Repeat _4-10_ times per set. Do __1-2__ sets per session. Do _2-3___ sessions per day.   Wringing towel using thumb and hand  5-10 times  Pinching towel with thumb and fingers  5-10 times   OKAY sign and hold for several seconds  Repeat 10 times   Try to use thumb for functional activities  D[employer] activities

## 2016-06-05 NOTE — Therapy (Signed)
[facility] [place] [address] [place], [place], [address] Phone: [phone]   Fax:  [phone]  Physical Therapy Evaluation  Patient Details  Name: Lisa Mccarty MRN: [id number] Date of Birth: February 08, 1961 Referring Provider: Dr Bradly Bienenstock  Encounter Date: 06/05/2016      PT End of Session - 06/05/16 1223    Visit Number 1   Number of Visits 12   Date for PT Re-Evaluation 07/17/16   PT Start Time 1118   PT Stop Time 1215   PT Time Calculation (min) 57 min   Activity Tolerance Patient tolerated treatment well      History reviewed. No pertinent past medical history.  History reviewed. No pertinent surgical history.  There were no vitals filed for this visit.       Subjective Assessment - 06/05/16 1115    Subjective Agnus reports that she has stiffmess in the Lt thumb which has been problematic for the past 4 months. She remembers hitting her hand on something hard and has had pain and stiffness since that time. She was seen by chiropractor for laser treatments that helped some. She continues to have pain and stiffness in the joint and limited functional abilities with Lt hand.    Pertinent History denies any musculoskeletal problems   Diagnostic tests xrays    Patient Stated Goals get hand and thumb moving again and use thoumb normally.    Currently in Pain? Yes   Pain Score 8    Pain Location Hand  thumb    Pain Orientation Left   Pain Descriptors / Indicators Tightness  stiffness    Pain Type Acute pain   Pain Onset More than a month ago   Pain Frequency Intermittent   Aggravating Factors  stiff when awakening in the morning; hitting hand or thumb on something; using the thumb for functional activities             [facility] PT Assessment - 06/05/16 0001      Assessment   Medical Diagnosis Lt trigger thumb    Referring Provider Dr Bradly Bienenstock   Onset Date/Surgical Date 02/12/16   Hand Dominance Right    Next MD Visit 2/18   Prior Therapy chiropractic care      Precautions   Precautions None     Balance Screen   Has the patient fallen in the past 6 months No   Has the patient had a decrease in activity level because of a fear of falling?  No   Is the patient reluctant to leave their home because of a fear of falling?  No     Prior Function   Level of Independence Independent   Vocation On disability  depression/anxiety ~ 5 years    Leisure LE exercises; walking 2-3 days/wk for ~ 5-10 min on treadmill; household chores - light activities      Observation/Other Assessments   Focus on Therapeutic Outcomes (FOTO)  73% limitation      Sensation   Additional Comments WFL's per pt report      Posture/Postural Control   Posture Comments head forward; shoudlers rounded and elevated; increased thoracic kyphosis     AROM   Right/Left Shoulder --  WNL's   Right/Left Elbow --  WNL's   Right/Left Forearm --  WFL's   Right/Left Wrist --  WNL's   Right/Left Finger --  WNL's   Right Composite Finger Extension --  WNL's   Right Composite Finger Flexion --  WNL's   Left Composite Finger Extension --  WNL's    Left Composite Finger Flexion --  WNL's   Right/Left Thumb --  Rt thumb DIP flexion 75 deg; Lt 42 deg    Right Thumb Opposition --  thumb to hypothenar eminence    Left Thumb Opposition --  ~ 1 cm from hypothenar eminence     Strength   Right/Left Shoulder --  5/5 bilat    Right/Left Elbow --  5/5 bilat    Right/Left Forearm --  5/5 bilat    Right/Left Wrist --  5/5 bilat    Right Hand Grip (lbs) --  40/50/55   Right Hand Lateral Pinch --  6/8/8   Right Hand 3 Point Pinch --  5/5/5   Left Hand Grip (lbs) --  15/22/15   Left Hand Lateral Pinch --  6/5/5   Left Hand 3 Point Pinch --  3/3.5/4     Palpation   Palpation comment tender with deep palpation mid webb space Lt thumb                    [facility] Adult PT Treatment/Exercise - 06/05/16 0001       Hand Exercises   DIPJ Flexion AAROM;Self ROM;Left;10 reps  Lt thumb   Opposition AROM;Left;10 reps  to hypothenar eminence    Thumb Opposition to each finger tip x 10    Other Hand Exercises pinch clothes pin x 10; hold marble with good DIP flexion Lt thumb ~ 20 sec x 3    Other Hand Exercises wrist/hand grip with towel x 10-15; pinching towel ~ 10 reps      Moist Heat Therapy   Number Minutes Moist Heat 15 Minutes   Moist Heat Location Hand  Lt                PT Education - 06/05/16 1208    Education provided Yes   Education Details HEP; encouraged increased functional use of Lt thumb/hand   Person(s) Educated Patient   Methods Explanation;Demonstration;Tactile cues;Verbal cues;Handout   Comprehension Verbalized understanding;Returned demonstration;Verbal cues required;Tactile cues required             PT Long Term Goals - 06/05/16 1236      PT LONG TERM GOAL #1   Title Increase AROM Lt thumb flexion to within 5 degrees of Rt 07/17/16   Time 6   Period Weeks   Status New     PT LONG TERM GOAL #2   Title Increased grip and pinch strength Lt hand and thumb to within 2-8 pounds of Rt 07/17/16   Time 6   Period Weeks   Status New     PT LONG TERM GOAL #3   Title Nicole Kindredgnes reports that she is using Lt UE for functional activities 07/17/16   Time 6   Period Weeks   Status New     PT LONG TERM GOAL #4   Title Independent in HEP 07/17/16   Time 6   Period Weeks   Status New     PT LONG TERM GOAL #5   Title Improve FOTO to 44% limitation 07/17/16   Time 6   Period Weeks   Status New               Plan - 06/05/16 1224    Clinical Impression Statement Nicole Kindredgnes presents with a four month history of Lt thumb pain and stiffness. She has decreased AROM Lt thumb DIP flexion; decreased  grip, lateral pinch and 3 jaw pinch strength. She does have triggering of Lt thumb with return for Lt thumb DIP flexion into extension.  Brooklynn hold thumb and hand in stiff guarded  posture; avoiding any movement or functional activities with Lt thumb. She has been diagnosed with Lt trigger thumb and she has been treatment with chiropractic care for several weeks. Akasia reports some improvement with laser treatment from chiropractor but continues to have sharp pain in the mornings and with certain movements on the Lt thumb. She feeos better following today's PT visit, stating that she now knows what to do to help her thumb get moving again    Rehab Potential Good   PT Frequency 2x / week   PT Duration 6 weeks   PT Treatment/Interventions Patient/family education;ADLs/Self Care Home Management;Neuromuscular re-education;Electrical Stimulation;Cryotherapy;Iontophoresis 4mg /ml Dexamethasone;Moist Heat;Ultrasound;Dry needling;Manual techniques;Therapeutic activities;Therapeutic exercise   PT Next Visit Plan progress with AROM exercises Lt thumb and hand; add functional activities with Lt thumb and hand; progress HEP as indicated; soft tissue work through the thumb webb space and thenar eminence Lt hand; modalities as indicated    Consulted and Agree with Plan of Care Patient      Patient will benefit from skilled therapeutic intervention in order to improve the following deficits and impairments:  Improper body mechanics, Pain, Decreased strength, Impaired UE functional use, Decreased activity tolerance  Visit Diagnosis: Stiffness of left hand, not elsewhere classified - Plan: PT plan of care cert/re-cert  Weakness generalized - Plan: PT plan of care cert/re-cert  Other symptoms and signs involving the musculoskeletal system - Plan: PT plan of care cert/re-cert     Problem List There are no active problems to display for this patient.   Latha Staunton Rober Minion PT, MPH  06/05/2016, 12:47 PM  [facility] [address] [place], [place], [address] Phone: [phone]   Fax:  [phone]  Name: Sherece Gambrill MRN:  [id number] Date of Birth: [birth year]

## 2016-06-08 ENCOUNTER — Encounter: Payer: Self-pay | Admitting: Rehabilitative and Restorative Service Providers"

## 2016-06-08 ENCOUNTER — Ambulatory Visit (INDEPENDENT_AMBULATORY_CARE_PROVIDER_SITE_OTHER): Payer: BC Managed Care – PPO | Admitting: Rehabilitative and Restorative Service Providers"

## 2016-06-08 DIAGNOSIS — R531 Weakness: Secondary | ICD-10-CM

## 2016-06-08 DIAGNOSIS — M25642 Stiffness of left hand, not elsewhere classified: Secondary | ICD-10-CM | POA: Diagnosis not present

## 2016-06-08 DIAGNOSIS — R29898 Other symptoms and signs involving the musculoskeletal system: Secondary | ICD-10-CM | POA: Diagnosis not present

## 2016-06-08 NOTE — Therapy (Addendum)
Lisa Mccarty, [place], [address] Phone: [phone]   Fax:  [phone]  Physical Therapy Treatment  Patient Details  Name: Lisa Mccarty MRN: [id number] Date of Birth: 05-15-1961 Referring Provider: Dr Iran Planas  Encounter Date: 06/08/2016      PT End of Session - 06/08/16 1031    Visit Number 2   Number of Visits 12   Date for PT Re-Evaluation 07/17/16   PT Start Time 1016   PT Stop Time 1104   PT Time Calculation (min) 48 min   Activity Tolerance Patient tolerated treatment well      History reviewed. No pertinent past medical history.  History reviewed. No pertinent surgical history.  There were no vitals filed for this visit.      Subjective Assessment - 06/08/16 1027    Subjective Lisa Mccarty reports that her hand is feeling much better. She is using the Lt hand for functional activities around her house now and can tell the thumb is improving. Still has pain when the thumb triggers and when she squeezes something with her hand.    Currently in Pain? Yes   Pain Score 8   no pain at rest    Pain Location Hand   Pain Orientation Left   Pain Descriptors / Indicators Tightness   Pain Type Acute pain   Pain Onset More than a month ago   Pain Frequency Intermittent                         [facility] Adult PT Treatment/Exercise - 06/08/16 0001      Hand Exercises   DIPJ Flexion AAROM;Self ROM;Left;10 reps  Lt thumb   Opposition AROM;Left;10 reps  to hypothenar eminence    Thumb Opposition to each finger tip x 10    Tendon Glides holding Lt hand with thumb in abduction supporting CMC and MP joint working on flexion and extensin of DIP    Other Hand Exercises guided tumb opposition with support to prevent triggering of Lt thumb      Moist Heat Therapy   Number Minutes Moist Heat 20 Minutes   Moist Heat Location Hand  Lt - 10 in before exercise and 10 min post exercise       Manual Therapy   Manual therapy comments working through [employer] hand with pt sitting UE supported on table    Joint Mobilization Lt CMC; MP; DIP thumb   Soft tissue mobilization working through the Medical laboratory scientific officer; Lt webb space; Lt hand and thumb                      PT Long Term Goals - 06/08/16 1030      PT LONG TERM GOAL #1   Title Increase AROM Lt thumb flexion to within 5 degrees of Rt 07/17/16   Time 6   Period Weeks   Status On-going     PT LONG TERM GOAL #2   Title Increased grip and pinch strength Lt hand and thumb to within 2-8 pounds of Rt 07/17/16   Time 6   Period Weeks   Status On-going     PT LONG TERM GOAL #3   Title Lisa Mccarty reports that she is using Lt UE for functional activities 07/17/16   Time 6   Period Weeks   Status On-going     PT LONG TERM GOAL #4   Title Independent in HEP 07/17/16  Time 6   Period Weeks   Status On-going     PT LONG TERM GOAL #5   Title Improve FOTO to 44% limitation 07/17/16   Time 6   Period Weeks   Status On-going               Plan - 06/08/16 1032    Clinical Impression Statement Improved mobility and posturing Lt thumb. Patient reports increaed functional use Lt hand/UE. She continues to experience triggering of the Lt thumb with abduction of thumb and activities involving gripping with Lt hand. Progressing toward stated goals of therapy. patient will call to schedule additional appointments as she feels she needs.    Rehab Potential Good   PT Frequency 2x / week   PT Duration 6 weeks   PT Treatment/Interventions Patient/family education;ADLs/Self Care Home Management;Neuromuscular re-education;Electrical Stimulation;Cryotherapy;Iontophoresis '4mg'$ /ml Dexamethasone;Moist Heat;Ultrasound;Dry needling;Manual techniques;Therapeutic activities;Therapeutic exercise   PT Next Visit Plan progress with AROM exercises Lt thumb and hand; add functional activities with Lt thumb and hand; progress HEP as indicated; soft  tissue work through the thumb webb space and thenar eminence Lt hand; modalities as indicated. Patient will call to schedule as she feels she needs to return.    Consulted and Agree with Plan of Care Patient      Patient will benefit from skilled therapeutic intervention in order to improve the following deficits and impairments:  Improper body mechanics, Pain, Decreased strength, Impaired UE functional use, Decreased activity tolerance  Visit Diagnosis: Stiffness of left hand, not elsewhere classified  Weakness generalized  Other symptoms and signs involving the musculoskeletal system     Problem List There are no active problems to display for this patient.   [place], MPH  06/08/2016, 12:05 PM  [facility] [place], [place], [address] Phone: [phone]   Fax:  [phone]  Name: Lisa Mccarty MRN: [id number] Date of Birth: 20-Dec-1960  PHYSICAL THERAPY DISCHARGE SUMMARY  Visits from Start of Care: 2  Current functional level related to goals / functional outcomes: Good improvement between initial and second visits with less tightness and improved movement in thumb. Patient reported usning thumb/hand for more functional activities experiencing less triggering of thumb.   Remaining deficits: Continued triggering at times especially when using hand for gripping activities   Education / Equipment: HEP Plan: Patient agrees to discharge.  Patient goals were partially met. Patient is being discharged due to not returning since the last visit.  ?????     Laurent Cargile P. Helene Kelp PT, MPH 07/05/16 2:54 PM

## 2018-07-19 ENCOUNTER — Encounter: Payer: Self-pay | Admitting: Rehabilitative and Restorative Service Providers"

## 2018-07-19 ENCOUNTER — Other Ambulatory Visit: Payer: Self-pay

## 2018-07-19 ENCOUNTER — Ambulatory Visit (INDEPENDENT_AMBULATORY_CARE_PROVIDER_SITE_OTHER): Payer: BC Managed Care – PPO | Admitting: Rehabilitative and Restorative Service Providers"

## 2018-07-19 DIAGNOSIS — M79605 Pain in left leg: Secondary | ICD-10-CM | POA: Diagnosis not present

## 2018-07-19 DIAGNOSIS — M5442 Lumbago with sciatica, left side: Secondary | ICD-10-CM

## 2018-07-19 DIAGNOSIS — G8929 Other chronic pain: Secondary | ICD-10-CM

## 2018-07-19 DIAGNOSIS — M6281 Muscle weakness (generalized): Secondary | ICD-10-CM | POA: Diagnosis not present

## 2018-07-19 DIAGNOSIS — R29898 Other symptoms and signs involving the musculoskeletal system: Secondary | ICD-10-CM

## 2018-07-19 NOTE — Patient Instructions (Signed)
Trunk: Prone Extension (Press-Ups)    Lie on stomach on firm, flat surface. Relax bottom and legs. Raise chest in air with elbows straight. Keep hips flat on surface, sag stomach. Hold __2-3__ seconds. Repeat _10___ times. Do __2-3__ sessions per day. CAUTION: Movement should be gentle and slow.  HIP: Hamstrings - Supine  Place strap around foot. Raise leg up, keeping knee straight.  Bend opposite knee to protect back if indicated. Hold 30 seconds. 3 reps per set, 2-3 sets per day   Piriformis Stretch   Lying on back, pull right knee toward opposite shoulder. Hold 30 seconds. Repeat 3 times. Do 2-3 sessions per day.   Sleeping on Back  Place pillow under knees. A pillow with cervical support and a roll around waist are also helpful. Copyright  VHI. All rights reserved.  Sleeping on Side Place pillow between knees. Use cervical support under neck and a roll around waist as needed. Copyright  VHI. All rights reserved.   Sleeping on Stomach   If this is the only desirable sleeping position, place pillow under lower legs, and under stomach or chest as needed.  Posture - Sitting   Sit upright, head facing forward. Try using a roll to support lower back. Keep shoulders relaxed, and avoid rounded back. Keep hips level with knees. Avoid crossing legs for long periods. Stand to Sit / Sit to Stand   To sit: Bend knees to lower self onto front edge of chair, then scoot back on seat. To stand: Reverse sequence by placing one foot forward, and scoot to front of seat. Use rocking motion to stand up.   Work Height and Reach  Ideal work height is no more than 2 to 4 inches below elbow level when standing, and at elbow level when sitting. Reaching should be limited to arm's length, with elbows slightly bent.  Bending  Bend at hips and knees, not back. Keep feet shoulder-width apart.    Posture - Standing   Good posture is important. Avoid slouching and forward head thrust.  Maintain curve in low back and align ears over shoul- ders, hips over ankles.  Alternating Positions   Alternate tasks and change positions frequently to reduce fatigue and muscle tension. Take rest breaks. Computer Work   Position work to Art gallery manager. Use proper work and seat height. Keep shoulders back and down, wrists straight, and elbows at right angles. Use chair that provides full back support. Add footrest and lumbar roll as needed.  Getting Into / Out of Car  Lower self onto seat, scoot back, then bring in one leg at a time. Reverse sequence to get out.  Dressing  Lie on back to pull socks or slacks over feet, or sit and bend leg while keeping back straight.    Housework - Sink  Place one foot on ledge of cabinet under sink when standing at sink for prolonged periods.   Pushing / Pulling  Pushing is preferable to pulling. Keep back in proper alignment, and use leg muscles to do the work.  Deep Squat   Squat and lift with both arms held against upper trunk. Tighten stomach muscles without holding breath. Use smooth movements to avoid jerking.  Avoid Twisting   Avoid twisting or bending back. Pivot around using foot movements, and bend at knees if needed when reaching for articles.  Carrying Luggage   Distribute weight evenly on both sides. Use a cart whenever possible. Do not twist trunk. Move body as a unit.  Lifting Principles .Maintain proper posture and head alignment. .Slide object as close as possible before lifting. .Move obstacles out of the way. .Test before lifting; ask for help if too heavy. .Tighten stomach muscles without holding breath. .Use smooth movements; do not jerk. .Use legs to do the work, and pivot with feet. .Distribute the work load symmetrically and close to the center of trunk. .Push instead of pull whenever possible.   Ask For Help   Ask for help and delegate to others when possible. Coordinate your movements when lifting  together, and maintain the low back curve.  Log Roll   Lying on back, bend left knee and place left arm across chest. Roll all in one movement to the right. Reverse to roll to the left. Always move as one unit. Housework - Sweeping  Use long-handled equipment to avoid stooping.   Housework - Wiping  Position yourself as close as possible to reach work surface. Avoid straining your back.  Laundry - Unloading Wash   To unload small items at bottom of washer, lift leg opposite to arm being used to reach.  Gardening - Raking  Move close to area to be raked. Use arm movements to do the work. Keep back straight and avoid twisting.     Cart  When reaching into cart with one arm, lift opposite leg to keep back straight.   Getting Into / Out of Bed  Lower self to lie down on one side by raising legs and lowering head at the same time. Use arms to assist moving without twisting. Bend both knees to roll onto back if desired. To sit up, start from lying on side, and use same move-ments in reverse. Housework - Vacuuming  Hold the vacuum with arm held at side. Step back and forth to move it, keeping head up. Avoid twisting.   Laundry - Armed forces training and education officer so that bending and twisting can be avoided.   Laundry - Unloading Dryer  Squat down to reach into clothes dryer or use a reacher.  Gardening - Weeding / Psychiatric nurse or Kneel. Knee pads may be helpful.                    TENS UNIT: This is helpful for muscle pain and spasm.   Search and Purchase a TENS 7000 2nd edition at www.tenspros.com. It should be less than $30.     TENS unit instructions: Do not shower or bathe with the unit on Turn the unit off before removing electrodes or batteries If the electrodes lose stickiness add a drop of water to the electrodes after they are disconnected from the unit and place on plastic sheet. If you continued to have difficulty, call the TENS  unit company to purchase more electrodes. Do not apply lotion on the skin area prior to use. Make sure the skin is clean and dry as this will help prolong the life of the electrodes. After use, always check skin for unusual red areas, rash or other skin difficulties. If there are any skin problems, does not apply electrodes to the same area. Never remove the electrodes from the unit by pulling the wires. Do not use the TENS unit or electrodes other than as directed. Do not change electrode placement without consultating your therapist or physician. Keep 2 fingers with between each electrode.

## 2018-07-19 NOTE — Therapy (Signed)
[facility] [place] [address] [place], [place], [address] Phone: [phone]   Fax:  [phone]  Physical Therapy Evaluation  Patient Details  Name: Lisa Mccarty MRN: [id number] Date of Birth: December 21, 1960 Referring Provider (PT): Dr Jerene Dilling    Encounter Date: 07/19/2018  PT End of Session - 07/19/18 1247    Visit Number  1    Number of Visits  12    Date for PT Re-Evaluation  08/30/18    PT Start Time  1015    PT Stop Time  1109    PT Time Calculation (min)  54 min    Activity Tolerance  Patient tolerated treatment well       History reviewed. No pertinent past medical history.  History reviewed. No pertinent surgical history.  There were no vitals filed for this visit.   Subjective Assessment - 07/19/18 1031    Subjective  patient reports that she has been having Lt LE pain over the past several year with increase in symptoms in the past year. Patient has pain in the Lt LE as well as some numbness.     Pertinent History  Lt knee surgery 2012; HTN     Patient Stated Goals  get rid of the pain or help pain to improve     Currently in Pain?  Yes    Pain Score  9     Pain Location  Leg    Pain Orientation  Left    Pain Descriptors / Indicators  Tingling;Numbness;Throbbing    Pain Type  Chronic pain    Pain Radiating Towards  in Lt leg posterior Lt thigh - with some "pop pop" in the calf when walking     Pain Onset  More than a month ago    Pain Frequency  Intermittent    Aggravating Factors   standing; walking; sitting     Pain Relieving Factors  elevate legs; pillow under legs          [facility] PT Assessment - 07/19/18 0001      Assessment   Medical Diagnosis  Lt LBP with Lt scaitica     Referring Provider (PT)  Dr Jerene Dilling     Onset Date/Surgical Date  05/15/17   pain present for the past 8 yrs - worsening in the past year   Hand Dominance  Right    Prior Therapy  here for Lt LE and for Lt  thumb       Precautions   Precautions  None      Restrictions   Weight Bearing Restrictions  No      Balance Screen   Has the patient fallen in the past 6 months  No    Has the patient had a decrease in activity level because of a fear of falling?   No    Is the patient reluctant to leave their home because of a fear of falling?   No      Prior Function   Level of Independence  Independent    Vocation  On disability   since 2012   Leisure  sedentary lifestyle - household activities at her own pase - does "leg stretches" about 3 x/wk for ~ 15 min       Observation/Other Assessments   Focus on Therapeutic Outcomes (FOTO)   66% limitation       Sensation   Additional Comments  intermittent numbness Lt LE  Posture/Postural Control   Posture Comments  head forward; shoudlers rounded; flexed forward at hips; wt shifted to the Rt in standing       AROM   Lumbar Flexion  50% painful    Lumbar Extension  60% centralized LBP less pain than fwd flexion     Lumbar - Right Side Bend  75%    Lumbar - Left Side Bend  75%    Lumbar - Right Rotation  15%    Lumbar - Left Rotation  15%       Strength   Right Hip Flexion  --   5-/5    Right Hip Extension  4+/5    Right Hip ABduction  --   5-/5   Left Hip Flexion  4/5    Left Hip Extension  4-/5    Left Hip ABduction  4-/5      Flexibility   Hamstrings  tight Lt > Rt    Quadriceps  prone Rt 105 deg; Lt 90     ITB  tight Lt > Rt    Piriformis  tight and painful Lt       Palpation   Spinal mobility  tightness and pain with spring testing Lt L4/5/S1    Palpation comment  tightness through the Lt lumbar paraspinals and posterior lateral hip into posterior thigh       Transfers   Comments  difficulty with all transitional movements/transfers       Ambulation/Gait   Gait Comments  limp on Lt LE in Wt bearing on Lt; stiff Lt LE with swing through; decreased stance time Lt LE                 Objective measurements  completed on examination: See above findings.      [facility] Adult PT Treatment/Exercise - 07/19/18 0001      Self-Care   Self-Care  --   initiated back care education      Lumbar Exercises: Stretches   Passive Hamstring Stretch  Left;2 reps;30 seconds   supine with strap    Press Ups  10 reps   2-3 sec hold - partial range - to pt tolerance    Piriformis Stretch  Left;2 reps;30 seconds   supine travell - using strap      Moist Heat Therapy   Number Minutes Moist Heat  15 Minutes    Moist Heat Location  Lumbar Spine;Hip   LB to posterior thigh             PT Education - 07/19/18 1058    Education Details  HEP back care     Person(s) Educated  Patient    Methods  Explanation;Demonstration;Tactile cues;Verbal cues;Handout    Comprehension  Verbalized understanding;Returned demonstration;Verbal cues required;Tactile cues required          PT Long Term Goals - 07/19/18 1325      PT LONG TERM GOAL #1   Title  Increase AROM Lt LE with patient to demonstrated functional mobility Lt LE 08/30/2018    Time  6    Period  Weeks    Status  New      PT LONG TERM GOAL #2   Title  Increased Lt LE strength to 4/5 to 5-/5 throughout 08/30/2018    Time  6    Period  Weeks    Status  New      PT LONG TERM GOAL #3   Title  Improve gait pattern with patient to  demonstrate improved weight shift and weight bearing Lt LE and report ability to walk for short functional distances with decreased pain 08/30/2018    Time  6    Period  Weeks    Status  New      PT LONG TERM GOAL #4   Title  Independent in HEP 08/30/2018    Time  6    Period  Weeks    Status  New      PT LONG TERM GOAL #5   Title  Improve FOTO to 49% limitation 08/30/2018    Time  6    Period  Weeks    Status  New             Plan - 07/19/18 1258    Clinical Impression Statement  Patient presents with increased Lt LE pain over the past year. Paitent has had pain in the Lt LE for the past 8 years. She has  abnormal posture and alignment; abnormal, antalgic gait pattern; decreased trunk and LE mobility/ROM; pain with spring testing through the lumbar spine; muscular tightness through the posterior hip, thigh, calf Lt LE; pain limiting functional activity level. Patient will benefit from trial of PT to address problems identified.      Personal Factors and Comorbidities  Education;Behavior Pattern;Comorbidity 1;Past/Current Experience;Time since onset of injury/illness/exacerbation    Comorbidities  chronic pain     Examination-Activity Limitations  Squat;Stairs;Stand;Locomotion Level;Transfers;Sit    Examination-Participation Restrictions  Community Activity;Other    Clinical Decision Making  Moderate    Rehab Potential  Fair    PT Frequency  2x / week    PT Duration  6 weeks    PT Treatment/Interventions  Patient/family education;ADLs/Self Care Home Management;Cryotherapy;Electrical Stimulation;Iontophoresis 4mg /ml Dexamethasone;Moist Heat;Traction;Ultrasound;Spinal Manipulations;Joint Manipulations;Passive range of motion;Dry needling;Manual techniques;Neuromuscular re-education;Therapeutic exercise;Therapeutic activities;Balance training    PT Next Visit Plan  review HEP; progress with movement education; back care; stretching; strengthening; gait training; manual work; modalities as indicated     Consulted and Agree with Plan of Care  Patient       Patient will benefit from skilled therapeutic intervention in order to improve the following deficits and impairments:  Pain, Postural dysfunction, Improper body mechanics, Increased fascial restricitons, Increased muscle spasms, Hypomobility, Decreased strength, Decreased mobility, Decreased range of motion, Decreased activity tolerance, Abnormal gait  Visit Diagnosis: Chronic left-sided low back pain with left-sided sciatica - Plan: PT plan of care cert/re-cert  Pain in left leg - Plan: PT plan of care cert/re-cert  Other symptoms and signs  involving the musculoskeletal system - Plan: PT plan of care cert/re-cert  Muscle weakness (generalized) - Plan: PT plan of care cert/re-cert     Problem List There are no active problems to display for this patient.   Fletcher Ostermiller Rober Minion PT, MPH  07/19/2018, 1:31 PM  [facility] [address] [place], [place], [address] Phone: [phone]   Fax:  [phone]  Name: Lisa Mccarty MRN: [id number] Date of Birth: [birth year]

## 2018-07-23 ENCOUNTER — Ambulatory Visit (INDEPENDENT_AMBULATORY_CARE_PROVIDER_SITE_OTHER): Payer: BC Managed Care – PPO | Admitting: Rehabilitative and Restorative Service Providers"

## 2018-07-23 ENCOUNTER — Encounter: Payer: Self-pay | Admitting: Rehabilitative and Restorative Service Providers"

## 2018-07-23 DIAGNOSIS — R29898 Other symptoms and signs involving the musculoskeletal system: Secondary | ICD-10-CM | POA: Diagnosis not present

## 2018-07-23 DIAGNOSIS — M6281 Muscle weakness (generalized): Secondary | ICD-10-CM

## 2018-07-23 DIAGNOSIS — M5442 Lumbago with sciatica, left side: Secondary | ICD-10-CM | POA: Diagnosis not present

## 2018-07-23 DIAGNOSIS — G8929 Other chronic pain: Secondary | ICD-10-CM

## 2018-07-23 DIAGNOSIS — M79605 Pain in left leg: Secondary | ICD-10-CM

## 2018-07-23 NOTE — Patient Instructions (Addendum)
Trigger Point Dry Needling  . What is Trigger Point Dry Needling (DN)? o DN is a physical therapy technique used to treat muscle pain and dysfunction. Specifically, DN helps deactivate muscle trigger points (muscle knots).  o A thin filiform needle is used to penetrate the skin and stimulate the underlying trigger point. The goal is for a local twitch response (LTR) to occur and for the trigger point to relax. No medication of any kind is injected during the procedure.   . What Does Trigger Point Dry Needling Feel Like?  o The procedure feels different for each individual patient. Some patients report that they do not actually feel the needle enter the skin and overall the process is not painful. Very mild bleeding may occur. However, many patients feel a deep cramping in the muscle in which the needle was inserted. This is the local twitch response.   Marland Kitchen How Will I feel after the treatment? o Soreness is normal, and the onset of soreness may not occur for a few hours. Typically this soreness does not last longer than two days.  o Bruising is uncommon, however; ice can be used to decrease any possible bruising.  o In rare cases feeling tired or nauseous after the treatment is normal. In addition, your symptoms may get worse before they get better, this period will typically not last longer than 24 hours.   . What Can I do After My Treatment? o Increase your hydration by drinking more water for the next 24 hours. o You may place ice or heat on the areas treated that have become sore, however, do not use heat on inflamed or bruised areas. Heat often brings more relief post needling. o You can continue your regular activities, but vigorous activity is not recommended initially after the treatment for 24 hours. o DN is best combined with other physical therapy such as strengthening, stretching, and other therapies.   Access Code: [email or web address]  URL: [email or web address]  Date: 07/23/2018   Prepared by: Corlis Leak   Exercises  Hooklying Isometric Clamshell - 10 reps - 1 sets - 2 sec hold - 2x daily - 7x weekly  Hooklying Shoulder I - 10 reps - 1 sets - 2 sec hold - 2x daily - 7x weekly  Supine March - 10 reps - 1 sets - 2sec hold - 2x daily - 7x weekly  Supine Lower Trunk Rotation - 10 reps - 1 sets - 2 sec hold - 2x daily - 7x weekly  Supine Diaphragmatic Breathing - 10 reps - 1 sets - 5-10 sec hold - 2x daily - 7x weekly

## 2018-07-23 NOTE — Therapy (Signed)
[facility] [place] [address] [place], [place], [address] Phone: [phone]   Fax:  [phone]  Physical Therapy Treatment  Patient Details  Name: Lisa Mccarty MRN: [id number] Date of Birth: 06/07/1960 Referring Provider (PT): Dr Jerene Dilling    Encounter Date: 07/23/2018  PT End of Session - 07/23/18 1027    Visit Number  2    Number of Visits  12    Date for PT Re-Evaluation  08/30/18    PT Start Time  1024   pt late for appt    PT Stop Time  1110   heat end of treatment    PT Time Calculation (min)  46 min    Activity Tolerance  Patient tolerated treatment well       History reviewed. No pertinent past medical history.  History reviewed. No pertinent surgical history.  There were no vitals filed for this visit.  Subjective Assessment - 07/23/18 1029    Subjective  Patient reports that she feels "ok". She is working on the back exercises some. She has continued to have pain with functional activities without change.     Currently in Pain?  Yes    Pain Score  8     Pain Location  Leg    Pain Orientation  Left    Pain Descriptors / Indicators  Tightness;Numbness;Throbbing    Pain Type  Chronic pain                       [facility] Adult PT Treatment/Exercise - 07/23/18 0001      Lumbar Exercises: Stretches   Passive Hamstring Stretch  Left;2 reps;30 seconds   supine with strap    Press Ups  10 reps   2-3 sec hold - partial range - to pt tolerance    Piriformis Stretch  Left;2 reps;30 seconds   supine travell - using strap      Lumbar Exercises: Aerobic   UBE (Upper Arm Bike)  L2 x 3 min fwd - incresaed symptoms back       Lumbar Exercises: Standing   Other Standing Lumbar Exercises  walking around gym after exercises x 1 lap       Lumbar Exercises: Supine   Clam  10 reps   alternating LE's    Bent Knee Raise  10 reps   alternating LE's    Other Supine Lumbar Exercises  trunk  rotation x 10 eac side     Other Supine Lumbar Exercises  diaphragmatic breathing x 10       Shoulder Exercises: Prone   Other Prone Exercises  alternate knee flexion x 10 reps       Moist Heat Therapy   Number Minutes Moist Heat  15 Minutes    Moist Heat Location  Lumbar Spine;Hip   LB to posterior thigh             PT Education - 07/23/18 1051    Education Details  HEP DN    Person(s) Educated  Patient    Methods  Explanation;Demonstration;Tactile cues;Verbal cues;Handout    Comprehension  Verbalized understanding;Returned demonstration;Verbal cues required;Tactile cues required          PT Long Term Goals - 07/19/18 1325      PT LONG TERM GOAL #1   Title  Increase AROM Lt LE with patient to demonstrated functional mobility Lt LE 08/30/2018    Time  6    Period  Weeks  Status  New      PT LONG TERM GOAL #2   Title  Increased Lt LE strength to 4/5 to 5-/5 throughout 08/30/2018    Time  6    Period  Weeks    Status  New      PT LONG TERM GOAL #3   Title  Improve gait pattern with patient to demonstrate improved weight shift and weight bearing Lt LE and report ability to walk for short functional distances with decreased pain 08/30/2018    Time  6    Period  Weeks    Status  New      PT LONG TERM GOAL #4   Title  Independent in HEP 08/30/2018    Time  6    Period  Weeks    Status  New      PT LONG TERM GOAL #5   Title  Improve FOTO to 49% limitation 08/30/2018    Time  6    Period  Weeks    Status  New            Plan - 07/23/18 1029    Clinical Impression Statement  Less antalgic gait noted today. Patient reports compliance with HEP. She continues to be limited with stretches. Added gentle mobility exercises in supine and prone and UBE. Discussed importance of diet, sleep, and exercise/movement in rehab for chronic pain conditions.     Personal Factors and Comorbidities  Education;Behavior Pattern;Comorbidity 1;Past/Current Experience;Time since  onset of injury/illness/exacerbation    Comorbidities  chronic pain     Examination-Activity Limitations  Squat;Stairs;Stand;Locomotion Level;Transfers;Sit    Examination-Participation Restrictions  Community Activity;Other    Rehab Potential  Fair    PT Frequency  2x / week    PT Duration  6 weeks    PT Treatment/Interventions  Patient/family education;ADLs/Self Care Home Management;Cryotherapy;Electrical Stimulation;Iontophoresis 4mg /ml Dexamethasone;Moist Heat;Traction;Ultrasound;Spinal Manipulations;Joint Manipulations;Passive range of motion;Dry needling;Manual techniques;Neuromuscular re-education;Therapeutic exercise;Therapeutic activities;Balance training    PT Next Visit Plan  review HEP; progress with movement education; back care; stretching; strengthening; gait training; manual work; modalities as indicated     Consulted and Agree with Plan of Care  Patient       Patient will benefit from skilled therapeutic intervention in order to improve the following deficits and impairments:  Pain, Postural dysfunction, Improper body mechanics, Increased fascial restricitons, Increased muscle spasms, Hypomobility, Decreased strength, Decreased mobility, Decreased range of motion, Decreased activity tolerance, Abnormal gait  Visit Diagnosis: Chronic left-sided low back pain with left-sided sciatica  Pain in left leg  Other symptoms and signs involving the musculoskeletal system  Muscle weakness (generalized)     Problem List There are no active problems to display for this patient.   Lisa Mccarty PT, MPH  07/23/2018, 11:04 AM  [facility] [address] [place], [place], [address] Phone: [phone]   Fax:  [phone]  Name: Lisa Mccarty MRN: [id number] Date of Birth: 1960/07/24

## 2018-07-26 ENCOUNTER — Other Ambulatory Visit: Payer: Self-pay

## 2018-07-26 ENCOUNTER — Ambulatory Visit (INDEPENDENT_AMBULATORY_CARE_PROVIDER_SITE_OTHER): Payer: BC Managed Care – PPO | Admitting: Rehabilitative and Restorative Service Providers"

## 2018-07-26 ENCOUNTER — Encounter: Payer: Self-pay | Admitting: Rehabilitative and Restorative Service Providers"

## 2018-07-26 DIAGNOSIS — M6281 Muscle weakness (generalized): Secondary | ICD-10-CM

## 2018-07-26 DIAGNOSIS — M5442 Lumbago with sciatica, left side: Secondary | ICD-10-CM

## 2018-07-26 DIAGNOSIS — M79605 Pain in left leg: Secondary | ICD-10-CM | POA: Diagnosis not present

## 2018-07-26 DIAGNOSIS — R29898 Other symptoms and signs involving the musculoskeletal system: Secondary | ICD-10-CM | POA: Diagnosis not present

## 2018-07-26 DIAGNOSIS — G8929 Other chronic pain: Secondary | ICD-10-CM

## 2018-07-26 NOTE — Therapy (Signed)
[facility] [place] [address] [place], [place], [address] Phone: [phone]   Fax:  [phone]  Physical Therapy Treatment  Patient Details  Name: Lisa Mccarty MRN: [id number] Date of Birth: 09-01-1960 Referring Provider (PT): Dr Jerene Dilling    Encounter Date: 07/26/2018  PT End of Session - 07/26/18 1120    Visit Number  3    Number of Visits  12    Date for PT Re-Evaluation  08/30/18    PT Start Time  1108    PT Stop Time  1202   patient 8 min late for appt   PT Time Calculation (min)  54 min    Activity Tolerance  Patient tolerated treatment well       History reviewed. No pertinent past medical history.  History reviewed. No pertinent surgical history.  There were no vitals filed for this visit.  Subjective Assessment - 07/26/18 1121    Subjective  Patient reports that she feels "ok". She is working on the back exercises some. She has continued to have pain.    Currently in Pain?  Yes    Pain Score  7     Pain Location  Leg    Pain Orientation  Left                       [facility] Adult PT Treatment/Exercise - 07/26/18 0001      Lumbar Exercises: Stretches   Lower Trunk Rotation  5 reps;10 seconds   hips and knees flexed to pt comfort level    Press Ups  10 reps   2-3 sec hold - partial range - to pt tolerance      Lumbar Exercises: Aerobic   Nustep  L4 UE(10)/LE x 5 min slowed with frequent stops for rest       Lumbar Exercises: Standing   Other Standing Lumbar Exercises  walking around gym after exercises x 1 lap       Lumbar Exercises: Seated   Sit to Stand  5 reps   without UE's    Other Seated Lumbar Exercises  stepping one foot then the other to one side then the other focus on lifting knees     Other Seated Lumbar Exercises  ankle pumps; knee flexion with ankle DF/PF x 5-7 reps ea       Lumbar Exercises: Supine   Clam  10 reps   alternating LE's    Bent Knee Raise  10  reps   alternating LE's    Bridge  5 reps;3 seconds   core engaged   Other Supine Lumbar Exercises  alternate shoulder flexion x 10 each UE       Shoulder Exercises: Prone   Other Prone Exercises  alternate knee flexion x 10 reps       Moist Heat Therapy   Number Minutes Moist Heat  15 Minutes    Moist Heat Location  Lumbar Spine;Hip   LB to posterior thigh                  PT Long Term Goals - 07/19/18 1325      PT LONG TERM GOAL #1   Title  Increase AROM Lt LE with patient to demonstrated functional mobility Lt LE 08/30/2018    Time  6    Period  Weeks    Status  New      PT LONG TERM GOAL #2   Title  Increased  Lt LE strength to 4/5 to 5-/5 throughout 08/30/2018    Time  6    Period  Weeks    Status  New      PT LONG TERM GOAL #3   Title  Improve gait pattern with patient to demonstrate improved weight shift and weight bearing Lt LE and report ability to walk for short functional distances with decreased pain 08/30/2018    Time  6    Period  Weeks    Status  New      PT LONG TERM GOAL #4   Title  Independent in HEP 08/30/2018    Time  6    Period  Weeks    Status  New      PT LONG TERM GOAL #5   Title  Improve FOTO to 49% limitation 08/30/2018    Time  6    Period  Weeks    Status  New            Plan - 07/26/18 1121    Clinical Impression Statement  Continued self limitiing with exercises - especially with the nu-step.  Requires repeated verbal cues and encouragement.     Personal Factors and Comorbidities  Education;Behavior Pattern;Comorbidity 1;Past/Current Experience;Time since onset of injury/illness/exacerbation    Comorbidities  chronic pain     Examination-Activity Limitations  Squat;Stairs;Stand;Locomotion Level;Transfers;Sit    Examination-Participation Restrictions  Community Activity;Other    Rehab Potential  Fair    PT Frequency  2x / week    PT Duration  6 weeks    PT Treatment/Interventions  Patient/family education;ADLs/Self  Care Home Management;Cryotherapy;Electrical Stimulation;Iontophoresis 4mg /ml Dexamethasone;Moist Heat;Traction;Ultrasound;Spinal Manipulations;Joint Manipulations;Passive range of motion;Dry needling;Manual techniques;Neuromuscular re-education;Therapeutic exercise;Therapeutic activities;Balance training    PT Next Visit Plan  review HEP; progress with movement education; back care; stretching; strengthening; gait training; manual work; modalities as indicated     Consulted and Agree with Plan of Care  Patient       Patient will benefit from skilled therapeutic intervention in order to improve the following deficits and impairments:  Pain, Postural dysfunction, Improper body mechanics, Increased fascial restricitons, Increased muscle spasms, Hypomobility, Decreased strength, Decreased mobility, Decreased range of motion, Decreased activity tolerance, Abnormal gait  Visit Diagnosis: Chronic left-sided low back pain with left-sided sciatica  Pain in left leg  Other symptoms and signs involving the musculoskeletal system  Muscle weakness (generalized)     Problem List There are no active problems to display for this patient.   Lisa Mccarty PT, MPH  07/26/2018, 11:49 AM  [facility] [address] [place], [place], [address] Phone: [phone]   Fax:  [phone]  Name: Lisa Mccarty MRN: [id number] Date of Birth: 1961/03/29

## 2018-07-29 ENCOUNTER — Ambulatory Visit (INDEPENDENT_AMBULATORY_CARE_PROVIDER_SITE_OTHER): Payer: BC Managed Care – PPO | Admitting: Rehabilitative and Restorative Service Providers"

## 2018-07-29 ENCOUNTER — Encounter: Payer: Self-pay | Admitting: Rehabilitative and Restorative Service Providers"

## 2018-07-29 ENCOUNTER — Other Ambulatory Visit: Payer: Self-pay

## 2018-07-29 DIAGNOSIS — R29898 Other symptoms and signs involving the musculoskeletal system: Secondary | ICD-10-CM | POA: Diagnosis not present

## 2018-07-29 DIAGNOSIS — M79605 Pain in left leg: Secondary | ICD-10-CM | POA: Diagnosis not present

## 2018-07-29 DIAGNOSIS — M6281 Muscle weakness (generalized): Secondary | ICD-10-CM

## 2018-07-29 DIAGNOSIS — G8929 Other chronic pain: Secondary | ICD-10-CM

## 2018-07-29 DIAGNOSIS — M5442 Lumbago with sciatica, left side: Secondary | ICD-10-CM

## 2018-07-29 NOTE — Patient Instructions (Addendum)
Achilles / Gastroc, Standing    Stand, right foot behind, heel on floor and turned slightly out, leg straight, forward leg bent. Move hips forward. Hold _30__ seconds. Repeat __3_ times per session. Do _2-3__ sessions per day.   Achilles / Soleus, Standing    Stand, right foot behind, heel on floor and turned slightly out. Lower hips and bend knees. Hold _30__ seconds. Repeat _3__ times per session. Do __2-3_ sessions per day.  Knee to Chest can have right knee bent for comfort     Lying supine, bend left knee to chest __5-10_ times; hold 10 sec.  Do _2__ times per day.       Combination (Hook-Lying)    Tighten core and slowly raise left leg and lower opposite arm over head. Keep trunk rigid. Repeat _10___ times per set. Do _1-2___ sets per session. Do __2__ sessions per day.

## 2018-07-29 NOTE — Therapy (Addendum)
[facility] [place], [place], [address] Phone: [phone]   Fax:  [phone]  Physical Therapy Treatment  Patient Details  Name: Lisa Mccarty MRN: [id number] Date of Birth: Jul 03, 1960 Referring Provider (PT): Dr Lavonia Dana    Encounter Date: 07/29/2018  PT End of Session - 07/29/18 1201    Visit Number  4    Number of Visits  12    Date for PT Re-Evaluation  08/30/18    PT Start Time  1200    PT Stop Time  1638   pt 15 min late for appt    PT Time Calculation (min)  35 min    Activity Tolerance  Patient tolerated treatment well       History reviewed. No pertinent past medical history.  History reviewed. No pertinent surgical history.  There were no vitals filed for this visit.  Subjective Assessment - 07/29/18 1202    Subjective  Patient reports that she feels "pretty good OK" today. She has spoken with her insurance company about stopping therpay due to the cost of PT and reports that she was advised to contact her MD about stopping PT     Currently in Pain?  Yes    Pain Score  7     Pain Location  Leg    Pain Orientation  Left    Pain Descriptors / Indicators  Tightness;Numbness;Throbbing    Pain Type  Chronic pain                       [facility] Adult PT Treatment/Exercise - 07/29/18 0001      Lumbar Exercises: Stretches   Single Knee to Chest Stretch  Left;5 reps;10 seconds    Lower Trunk Rotation  5 reps;10 seconds   hips and knees flexed to pt comfort level    Press Ups  10 reps   2-3 sec hold - partial range - to pt tolerance    Gastroc Stretch  Left;3 reps;20 seconds;30 seconds   1 rep Rt LE 30 sec hold    Other Lumbar Stretch Exercise  soleus stretch 30 sec x 3 reps Lt LE       Lumbar Exercises: Aerobic   Nustep  L3 UE(10)/LE x 5 min very slowed movement with frequent stops for rest    pt reports pain in the Lt LE with nustep today      Lumbar Exercises:  Standing   Other Standing Lumbar Exercises  walking around gym after exercises x 1 lap x 3 reps       Lumbar Exercises: Seated   Sit to Stand  5 reps   without UE's - verbal cues for weight shift/position     Lumbar Exercises: Supine   Clam  10 reps   alternating LE's red TB above knees    Bent Knee Raise  10 reps   alternating LE's    Dead Bug  10 reps    Bridge  10 reps;3 seconds   core engaged   Other Supine Lumbar Exercises  alternate shoulder flexion x 10 each UE       Shoulder Exercises: Prone   Other Prone Exercises  alternate knee flexion x 10 reps       Modalities   Modalities  --   pt declined heat post treatment             PT Education - 07/29/18 1233    Education Details  HEP     Person(s) Educated  Patient    Methods  Explanation;Demonstration;Tactile cues;Verbal cues;Handout    Comprehension  Verbalized understanding;Returned demonstration;Verbal cues required;Tactile cues required          PT Long Term Goals - 07/19/18 1325      PT LONG TERM GOAL #1   Title  Increase AROM Lt LE with patient to demonstrated functional mobility Lt LE 08/30/2018    Time  6    Period  Weeks    Status  New      PT LONG TERM GOAL #2   Title  Increased Lt LE strength to 4/5 to 5-/5 throughout 08/30/2018    Time  6    Period  Weeks    Status  New      PT LONG TERM GOAL #3   Title  Improve gait pattern with patient to demonstrate improved weight shift and weight bearing Lt LE and report ability to walk for short functional distances with decreased pain 08/30/2018    Time  6    Period  Weeks    Status  New      PT LONG TERM GOAL #4   Title  Independent in HEP 08/30/2018    Time  6    Period  Weeks    Status  New      PT LONG TERM GOAL #5   Title  Improve FOTO to 49% limitation 08/30/2018    Time  6    Period  Weeks    Status  New            Plan - 07/29/18 1202    Clinical Impression Statement  Patient reports that she understood from her insurance  company that she could not stop PT until she has it approved by her doctor. She has placed a call to her doctor but has not heard from him. She plans to stop therapy. Patient continues to self limit with all exercises and activities.  Added calf stretches to address "pop pop" in the Lt calf. Added gentle ROM and core stabilization for HEP.     Personal Factors and Comorbidities  Education;Behavior Pattern;Comorbidity 1;Past/Current Experience;Time since onset of injury/illness/exacerbation    Comorbidities  chronic pain     Examination-Activity Limitations  Squat;Stairs;Stand;Locomotion Level;Transfers;Sit    Examination-Participation Restrictions  Community Activity;Other    Rehab Potential  Fair    PT Frequency  2x / week    PT Duration  6 weeks    PT Treatment/Interventions  Patient/family education;ADLs/Self Care Home Management;Cryotherapy;Electrical Stimulation;Iontophoresis 57m/ml Dexamethasone;Moist Heat;Traction;Ultrasound;Spinal Manipulations;Joint Manipulations;Passive range of motion;Dry needling;Manual techniques;Neuromuscular re-education;Therapeutic exercise;Therapeutic activities;Balance training    PT Next Visit Plan  review HEP; progress with movement education; back care; stretching; strengthening; gait training; manual work; modalities as indicated Cotninue treatment as indicated     Consulted and Agree with Plan of Care  Patient       Patient will benefit from skilled therapeutic intervention in order to improve the following deficits and impairments:  Pain, Postural dysfunction, Improper body mechanics, Increased fascial restricitons, Increased muscle spasms, Hypomobility, Decreased strength, Decreased mobility, Decreased range of motion, Decreased activity tolerance, Abnormal gait  Visit Diagnosis: Chronic left-sided low back pain with left-sided sciatica  Pain in left leg  Other symptoms and signs involving the musculoskeletal system  Muscle weakness  (generalized)     Problem List There are no active problems to display for this patient.   Keala Drum PNilda SimmerPT, MPH  07/29/2018, 12:55 PM  [place], [place], [address] Phone: [phone]   Fax:  [phone]  Name: Nastashia Gallo MRN: [id number] Date of Birth: [birth year]   PHYSICAL THERAPY DISCHARGE SUMMARY  Visits from Start of Care: 4  Current functional level related to goals / functional outcomes: See progress note for discharge status    Remaining deficits: Continued chronic pain    Education / Equipment: HEP; encouraged exercise on a regular basis  Plan: Patient agrees to discharge.  Patient goals were not met. Patient is being discharged due to financial reasons.  ?????    Patient requested d/c from PT due to cost of visits.   Everardo All, PT, MPH  Acute Rehab [phone]

## 2018-08-02 ENCOUNTER — Encounter: Payer: BC Managed Care – PPO | Admitting: Rehabilitative and Restorative Service Providers"

## 2018-08-08 ENCOUNTER — Telehealth: Payer: Self-pay | Admitting: Rehabilitative and Restorative Service Providers"

## 2018-08-08 NOTE — Telephone Encounter (Signed)
Left message for patient to clarify rehab appointments. Patient was requesting from her MD office approval to discontinue Physical Therapy due to cost of visits. Ask that Nicole Kindred call back to confirm. Khianna Blazina P. Leonor Liv PT, MPH 08/08/18 10:17 AM

## 2022-12-06 ENCOUNTER — Ambulatory Visit
Admission: RE | Admit: 2022-12-06 | Discharge: 2022-12-06 | Disposition: A | Discharge status: Home / Self Care | Payer: BC Managed Care – PPO | Source: Ambulatory Visit | Attending: Internal Medicine | Admitting: Internal Medicine

## 2022-12-06 ENCOUNTER — Other Ambulatory Visit: Payer: Self-pay | Admitting: Internal Medicine

## 2022-12-06 DIAGNOSIS — M25562 Pain in left knee: Secondary | ICD-10-CM

## 2023-04-03 ENCOUNTER — Other Ambulatory Visit: Payer: Self-pay | Admitting: *Deleted

## 2023-04-03 DIAGNOSIS — I8393 Asymptomatic varicose veins of bilateral lower extremities: Secondary | ICD-10-CM

## 2023-04-05 ENCOUNTER — Ambulatory Visit (HOSPITAL_COMMUNITY)
Admission: RE | Admit: 2023-04-05 | Discharge: 2023-04-05 | Disposition: A | Discharge status: Home / Self Care | Payer: Managed Care, Other (non HMO) | Source: Ambulatory Visit | Attending: Vascular Surgery | Admitting: Vascular Surgery

## 2023-04-05 DIAGNOSIS — I8393 Asymptomatic varicose veins of bilateral lower extremities: Secondary | ICD-10-CM | POA: Diagnosis present

## 2023-04-06 ENCOUNTER — Ambulatory Visit (INDEPENDENT_AMBULATORY_CARE_PROVIDER_SITE_OTHER): Payer: BC Managed Care – PPO | Admitting: Physician Assistant

## 2023-04-06 VITALS — BP 119/82 | HR 78 | Temp 98.2°F | Ht 61.0 in | Wt 148.3 lb

## 2023-04-06 DIAGNOSIS — I872 Venous insufficiency (chronic) (peripheral): Secondary | ICD-10-CM

## 2023-04-06 DIAGNOSIS — I8393 Asymptomatic varicose veins of bilateral lower extremities: Secondary | ICD-10-CM | POA: Diagnosis not present

## 2023-04-06 DIAGNOSIS — M1712 Unilateral primary osteoarthritis, left knee: Secondary | ICD-10-CM | POA: Diagnosis not present

## 2023-04-06 NOTE — Progress Notes (Unsigned)
Requested by:  Renford Dills, MD [address] [place],  [place] [address]  Reason for consultation: BLE pain and stiffness    History of Present Illness   Lisa Mccarty is a 62 y.o. ([birth year]) female who presents for evaluation of bilateral lower extremity pain and stiffness.  The patient states she has dealt with bilateral lower extremity pain and stiffness since 2011.  She describes the pain as a constant aching.  This pain is localized around the distal hamstring, popliteal fossa, and proximal calf bilaterally.  Her pain is significantly worse in the left leg.  She states that this pain is bothersome all of the time but significantly worse after walking.  At night she has to sleep with hot water bottles around the back of her legs for pain relief.  She also notes after prolonged periods of sitting the bend of her legs feels very stiff.  When she first gets to a standing position after sitting for a long period of time, she has to move very slowly.  She feels like her legs go numb and have to "wake up".  During this time she experiences tingling in her calves.  She states if she is not careful getting up she will almost fall over.  She notes some issues with mild lower extremity swelling.  She denies any notable lower extremity ankle or foot pain or heaviness.  She denies any history of DVT or ulcerations.  She states she has tried compression stockings and leg elevation but this has not helped with her symptoms.  She remembers having some sort of orthopedic surgery around 2012 to her left leg.  She describes the surgery as "fluid removal" but does not remember anything else about it.  She does remember before her surgery that her left lower leg was fairly swollen.  She also notes having gone through physical therapy at least twice for her left leg.  She does not remember seeing any orthopedic specialist after her surgery in 2012.    Past Medical History:  Diagnosis Date    Hyperlipidemia    Hypertension     History reviewed. No pertinent surgical history.  Social History   Socioeconomic History   Marital status: Married    Spouse name: Not on file   Number of children: Not on file   Years of education: Not on file   Highest education level: Not on file  Occupational History   Not on file  Tobacco Use   Smoking status: Never   Smokeless tobacco: Never  Vaping Use   Vaping status: Never Used  Substance and Sexual Activity   Alcohol use: Not Currently   Drug use: Never   Sexual activity: Not on file  Other Topics Concern   Not on file  Social History Narrative   Not on file   Social Determinants of Health   Financial Resource Strain: Not on file  Food Insecurity: Not on file  Transportation Needs: Not on file  Physical Activity: Not on file  Stress: Not on file  Social Connections: Unknown (07/08/2022)   Received from [facility]   Social Network    Social Network: Not on file  Intimate Partner Violence: Unknown (07/08/2022)   Received from [facility]   HITS    Physically Hurt: Not on file    Insult or Talk Down To: Not on file    Threaten Physical Harm: Not on file    Scream or Curse: Not on file  History reviewed. No pertinent family history.  Current Outpatient Medications  Medication Sig Dispense Refill   amLODipine (NORVASC) 5 MG tablet TAKE 1 TABLET BY MOUTH EVERY DAY FOR 90 DAYS     FLUoxetine (PROZAC) 20 MG capsule Take 1 tablet by mouth daily.     traZODone (DESYREL) 100 MG tablet TAKE 1 TABLET BY MOUTH EVERYDAY AT BEDTIME     Fluoxetine HCl, PMDD, 10 MG TABS      No current facility-administered medications for this visit.    No Known Allergies  REVIEW OF SYSTEMS (negative unless checked):   Cardiac:  []  Chest pain or chest pressure? []  Shortness of breath upon activity? []  Shortness of breath when lying flat? []  Irregular heart rhythm?  Vascular:  []  Pain in calf, thigh, or hip brought on by  walking? []  Pain in feet at night that wakes you up from your sleep? []  Blood clot in your veins? [x]  Leg swelling?  Pulmonary:  []  Oxygen at home? []  Productive cough? []  Wheezing?  Neurologic:  []  Sudden weakness in arms or legs? []  Sudden numbness in arms or legs? []  Sudden onset of difficult speaking or slurred speech? []  Temporary loss of vision in one eye? []  Problems with dizziness?  Gastrointestinal:  []  Blood in stool? []  Vomited blood?  Genitourinary:  []  Burning when urinating? []  Blood in urine?  Psychiatric:  []  Major depression  Hematologic:  []  Bleeding problems? []  Problems with blood clotting?  Dermatologic:  []  Rashes or ulcers?  Constitutional:  []  Fever or chills?  Ear/Nose/Throat:  []  Change in hearing? []  Nose bleeds? []  Sore throat?  Musculoskeletal:  []  Back pain? [x]  Joint pain? []  Muscle pain?   Physical Examination     Vitals:   04/06/23 1254  BP: 119/82  Pulse: 78  Temp: 98.2 F (36.8 C)  TempSrc: Temporal  SpO2: 98%  Weight: 148 lb 4.8 oz (67.3 kg)  Height: 5\' 1"  (1.549 m)   Body mass index is 28.02 kg/m.  General:  WDWN in NAD; vital signs documented above Gait: Not observed HENT: WNL, normocephalic Pulmonary: normal non-labored breathing , without Rales, rhonchi,  wheezing Cardiac: Regular Abdomen: soft, NT, no masses Skin: without rashes Vascular Exam/Pulses: palpable pedal pulses bilaterally Extremities: clusters of varicose veins around the left distal hamstring.mild bilateral lower extremity edema.  No significant stasis pigmentation.  Notable bilateral knee swelling, left greater than right Musculoskeletal: no muscle wasting or atrophy  Neurologic: A&O X 3;  No focal weakness or paresthesias are detected Psychiatric:  The pt has a normal affect.    Non-invasive Vascular Imaging   LLE Venous Insufficiency Duplex (04/05/2023):   LEFT          Reflux NoRefluxReflux TimeDiameter cmsComments                           Yes                                   +--------------+---------+------+-----------+------------+--------+  CFV                    yes   >1 second                       +--------------+---------+------+-----------+------------+--------+  FV mid        no                                              +--------------+---------+------+-----------+------------+--------+  Popliteal    no                                              +--------------+---------+------+-----------+------------+--------+  GSV at [facility]    no                            0.88              +--------------+---------+------+-----------+------------+--------+  GSV prox thighno                            0.41              +--------------+---------+------+-----------+------------+--------+  GSV mid thigh           yes    >500 ms      0.29              +--------------+---------+------+-----------+------------+--------+  GSV dist thigh          yes    >500 ms      0.26              +--------------+---------+------+-----------+------------+--------+  GSV at knee   no                            0.23              +--------------+---------+------+-----------+------------+--------+  GSV prox calf           yes    >500 ms      0.19              +--------------+---------+------+-----------+------------+--------+  GSV mid calf  no                            0.18              +--------------+---------+------+-----------+------------+--------+  SSV Pop Fossa no                            0.16              +--------------+---------+------+-----------+------------+--------+  SSV prox calf no                            0.16              +--------------+---------+------+-----------+------------+--------+  SSV mid calf  no                            0.12               +--------------+---------+------+-----------+------------+--------+   Medical Decision Making   Kayslee Fremin is a 62 y.o. female who presents bilateral lower extremity pain  Based on the patient's duplex, there is reflux in the left common femoral vein and greater saphenous vein at the mid thigh, distal thigh, and proximal calf.  The remainder of the patient's deep and superficial venous system is competent.  There is no evidence of DVT or SVT on exam.  Given that the patient does not have reflux at the saphenofemoral junction, she is not a candidate for ablation The patient  describes bilateral lower extremity pain and stiffness since 2011.  Her pain is worse in the left leg.  Her pain is localized mostly behind the knee.  Her pain is worse at the end of the day and after sitting for prolonged periods of time. She endorses mild bilateral lower extremity swelling, however with no significant swelling around the ankles or feet.  She feels like the backs of her knees and proximal calves are swollen. She has tried compression stockings and leg elevation and says that neither of these therapies to help her symptoms.  The only pain relief she gets is when she uses hot water bottles on her legs at night. On exam she has mild bilateral lower extremity edema.  Her knees do appear fairly swollen, left greater than right.  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 I discussed with the patient likely most of her stiffness, numbness, and aching pain behind the knee is coming from untreated moderate/severe knee osteoarthritis.  Recent x-ray of the left knee in July demonstrates severe medial and patellofemoral arthritis.  I will refer her to an orthopedic doctor for evaluation.  Potentially the clusters of varicose veins behind her knees are causing her some pain. Despite this discussion, she insists having her varicose veins removed. I have discussed with her that she will require repeat  evaluation by Dr.Brabham or Dr.Cain in several months. If she is considered a candidate for stab phlebectomy at that time, we will pursue insurance clearance.  I encouraged her to keep wearing lower extremity compression stockings, elevate her legs, and exercise as tolerated to help with her symptoms    Ernestene Mention, PA-C [facility] Office: [phone]  04/06/2023, 2:36 PM  Clinic MD: Hetty Blend

## 2023-04-23 ENCOUNTER — Ambulatory Visit: Payer: Managed Care, Other (non HMO) | Admitting: Orthopedic Surgery

## 2023-05-04 ENCOUNTER — Encounter (HOSPITAL_COMMUNITY): Payer: Managed Care, Other (non HMO)

## 2023-05-23 NOTE — Progress Notes (Signed)
  Cardiology Office Note:  .   Date:  05/25/2023  ID:  Lisa Mccarty, DOB 1960/10/17, MRN [id number] PCP: Rexanne Ingle, MD  [facility] Providers Cardiologist:  None { History of Present Illness: .   Lisa Mccarty is a 63 y.o. female with history of HTN who presents for the evaluation of abnormal EKG at the request of Rexanne Ingle, MD.   History of Present Illness   Lisa Mccarty, a female with a history of hypertension, presents for evaluation of an abnormal EKG, SOB and chest pain. She has been inconsistently taking medication for her hypertension, attempting to manage it through diet. She denies being diabetic, though she was previously prediabetic. She has never had a heart attack or stroke and has no known family history of heart disease. She reports occasional chest discomfort and shortness of breath, particularly with exertion. Symptoms ongoing for 6 months. Occur with heavy activity. This discomfort is described as a sharp and dull sensation in her left chest. She first noticed these symptoms approximately six months ago during physical therapy. She also reports occasional swelling in her legs. Non smoker. No etoh. She is disabled. She has 1 child. Reports heart disease in her mother.       T chol 225 HDL 109 LDL 108 TG 48 A1c 5.7     Problem List Venous insufficiency  HTN    ROS: All other ROS reviewed and negative. Pertinent positives noted in the HPI.     Studies Reviewed: SABRA   EKG Interpretation Date/Time:  Friday May 25 2023 09:43:25 EST Ventricular Rate:  73 PR Interval:  184 QRS Duration:  114 QT Interval:  416 QTC Calculation: 458 R Axis:   -11  Text Interpretation: Normal sinus rhythm Anteroseptal infarct , age undetermined Confirmed by Barbaraann Kotyk [phone]) on 05/25/2023 9:44:51 AM   Physical Exam:   VS:  BP 124/82 (BP Location: Left Arm, Patient Position: Sitting, Cuff Size: Normal)   Pulse 73   Ht 5' 4 (1.626 m)   Wt 152 lb 9.6 oz (69.2 kg)    SpO2 98%   BMI 26.19 kg/m    Wt Readings from Last 3 Encounters:  05/25/23 152 lb 9.6 oz (69.2 kg)  04/06/23 148 lb 4.8 oz (67.3 kg)    GEN: Well nourished, well developed in no acute distress NECK: No JVD; No carotid bruits CARDIAC: RRR, no murmurs, rubs, gallops RESPIRATORY:  Clear to auscultation without rales, wheezing or rhonchi  ABDOMEN: Soft, non-tender, non-distended EXTREMITIES:  No edema; No deformity  ASSESSMENT AND PLAN: .   Assessment and Plan    Chest Discomfort Shortness of breath  Exertional sharp sensation in left chest and shortness of breath for the past 6 months. Unclear if symptoms represent angina. -Order echocardiogram and coronary CTA for further evaluation. BMP today. 100 mg metoprolol  tartrate 2 hours before scan.  -Follow-up as needed based on the results of testing.  Hypertension History of inconsistent medication use. 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 -controlled on norvasc.   Hyperlipidemia Total cholesterol 225, HDL 109, LDL 108, and triglycerides 48. -Continue lifestyle modifications.              Follow-up: Return if symptoms worsen or fail to improve.  Signed, Kotyk DASEN. Barbaraann, MD, [facility]  [address] [place], [place] [address] [phone]  10:02 AM

## 2023-05-25 ENCOUNTER — Ambulatory Visit: Payer: 59 | Attending: Cardiovascular Disease | Admitting: Cardiovascular Disease

## 2023-05-25 ENCOUNTER — Encounter: Payer: Self-pay | Admitting: Cardiovascular Disease

## 2023-05-25 VITALS — BP 124/82 | HR 73 | Ht 64.0 in | Wt 152.6 lb

## 2023-05-25 DIAGNOSIS — R072 Precordial pain: Secondary | ICD-10-CM | POA: Diagnosis not present

## 2023-05-25 DIAGNOSIS — I10 Essential (primary) hypertension: Secondary | ICD-10-CM

## 2023-05-25 DIAGNOSIS — R9431 Abnormal electrocardiogram [ECG] [EKG]: Secondary | ICD-10-CM

## 2023-05-25 DIAGNOSIS — R0602 Shortness of breath: Secondary | ICD-10-CM

## 2023-05-25 MED ORDER — METOPROLOL TARTRATE 100 MG PO TABS: 100.0000 mg | ORAL_TABLET | Freq: Once | ORAL | 0 refills | Status: AC

## 2023-05-25 NOTE — Patient Instructions (Signed)
 Medication Instructions:  - Take METOPROLOL  TARTRATE 100MG  2 hours before cardiac testing    *If you need a refill on your cardiac medications before your next appointment, please call your pharmacy*   Lab Work: BMET    If you have labs (blood work) drawn today and your tests are completely normal, you will receive your results only by: MyChart Message (if you have MyChart) OR A paper copy in the mail If you have any lab test that is abnormal or we need to change your treatment, we will call you to review the results.   Testing/Procedures:   Your cardiac CT will be scheduled at one of the below locations:   [facility] [address] [place], [place] [address] [phone]   If scheduled at [facility], please arrive at the [facility] (Entrance C2) of [facility] 30 minutes prior to test start time. You can use the FREE valet parking offered at entrance C (encouraged to control the heart rate for the test)  Proceed to the [facility] Radiology Department (first floor) to check-in and test prep.  All radiology patients and guests should use entrance C2 at [facility], accessed from [facility], even though the hospital's physical address listed is [address].    Please follow these instructions carefully (unless otherwise directed):  An IV will be required for this test and Nitroglycerin  will be given.  Hold all erectile dysfunction medications at least 3 days (72 hrs) prior to test. (Ie viagra, cialis, sildenafil, tadalafil, etc)   On the Night Before the Test: Be sure to Drink plenty of water. Do not consume any caffeinated/decaffeinated beverages or chocolate 12 hours prior to your test. Do not take any antihistamines 12 hours prior to your test.  On the Day of the Test: Drink plenty of water until 1 hour prior to the test. Do not eat any food 1 hour prior to test. You may take your  regular medications prior to the test.  Take metoprolol  (Lopressor ) two hours prior to test. If you take Furosemide/Hydrochlorothiazide/Spironolactone/Chlorthalidone, please HOLD on the morning of the test. Patients who wear a continuous glucose monitor MUST remove the device prior to scanning. FEMALES- please wear underwire-free bra if available, avoid dresses & tight clothing       After the Test: Drink plenty of water. After receiving IV contrast, you may experience a mild flushed feeling. This is normal. On occasion, you may experience a mild rash up to 24 hours after the test. This is not dangerous. If this occurs, you can take Benadryl 25 mg and increase your fluid intake. If you experience trouble breathing, this can be serious. If it is severe call 911 IMMEDIATELY. If it is mild, please call our office.  We will call to schedule your test 2-4 weeks out understanding that some insurance companies will need an authorization prior to the service being performed.   For more information and frequently asked questions, please visit our website : [email or web address]  For non-scheduling related questions, please contact the cardiac imaging nurse navigator should you have any questions/concerns: Cardiac Imaging Nurse Navigators Direct Office Dial: [phone]   For scheduling needs, including cancellations and rescheduling, please call Brittany, [phone].    Follow-Up: At [facility], you and your health needs are our priority.  As part of our continuing mission to provide you with exceptional heart care, we have created designated Provider Care Teams.  These Care Teams  include your primary Cardiologist (physician) and Advanced Practice Providers (APPs -  Physician Assistants and Nurse Practitioners) who all work together to provide you with the care you need, when you need it.  We recommend signing up for the patient portal called MyChart.  Sign up information is  provided on this After Visit Summary.  MyChart is used to connect with patients for Virtual Visits (Telemedicine).  Patients are able to view lab/test results, encounter notes, upcoming appointments, etc.  Non-urgent messages can be sent to your provider as well.   To learn more about what you can do with MyChart, go to [email or web address].    Your next appointment:   Follow up as needed pending results of cardiac testing     Provider:   Darryle Decent, MD    Other Instructions

## 2023-05-26 LAB — BASIC METABOLIC PANEL WITH GFR
BUN/Creatinine Ratio: 19 (ref 12–28)
BUN: 15 mg/dL (ref 8–27)
CO2: 22 mmol/L (ref 20–29)
Calcium: 9.7 mg/dL (ref 8.7–10.3)
Chloride: 102 mmol/L (ref 96–106)
Creatinine, Ser: 0.79 mg/dL (ref 0.57–1.00)
Glucose: 89 mg/dL (ref 70–99)
Potassium: 4.5 mmol/L (ref 3.5–5.2)
Sodium: 138 mmol/L (ref 134–144)
eGFR: 86 mL/min/1.73 (ref >59)

## 2023-06-06 ENCOUNTER — Telehealth (HOSPITAL_COMMUNITY): Payer: Self-pay | Admitting: *Deleted

## 2023-06-06 ENCOUNTER — Telehealth: Payer: Self-pay | Admitting: Cardiovascular Disease

## 2023-06-06 NOTE — Telephone Encounter (Signed)
Patient wants a call back to confirm instructions for her test tomorrow.

## 2023-06-06 NOTE — Telephone Encounter (Signed)
Attempted to call patient regarding upcoming cardiac CT appointment. °Left message on voicemail with name and callback number ° °Edie Vallandingham RN Navigator Cardiac Imaging °[facility] °[phone] Office °[phone] Cell ° °

## 2023-06-06 NOTE — Telephone Encounter (Signed)
Reaching out to patient to offer assistance regarding upcoming cardiac imaging study; pt verbalizes understanding of appt date/time, parking situation and where to check in, pre-test NPO status and medications ordered, and verified current allergies; name and call back number provided for further questions should they arise  Lisa Brick RN Navigator Cardiac Imaging [facility] [phone] office [phone] cell  Patient to take 100mg  metoprolol tartrate two hours prior to her cardiac CT scan. She is aware to arrive at 12 PM.

## 2023-06-07 ENCOUNTER — Ambulatory Visit (HOSPITAL_COMMUNITY)
Admission: RE | Admit: 2023-06-07 | Discharge: 2023-06-07 | Disposition: A | Discharge status: Home / Self Care | Payer: Managed Care, Other (non HMO) | Source: Ambulatory Visit | Attending: Cardiovascular Disease | Admitting: Cardiovascular Disease

## 2023-06-07 DIAGNOSIS — R072 Precordial pain: Secondary | ICD-10-CM | POA: Diagnosis not present

## 2023-06-07 MED ORDER — IOHEXOL 350 MG/ML SOLN
95.0000 mL | Freq: Once | INTRAVENOUS | Status: AC | PRN
  Administered 2023-06-07: 95 mL via INTRAVENOUS

## 2023-06-07 MED ORDER — NITROGLYCERIN 0.4 MG SL SUBL
SUBLINGUAL_TABLET | SUBLINGUAL | Status: AC
  Filled 2023-06-07: qty 2

## 2023-06-07 MED ORDER — NITROGLYCERIN 0.4 MG SL SUBL
0.8000 mg | SUBLINGUAL_TABLET | Freq: Once | SUBLINGUAL | Status: AC
  Administered 2023-06-07: 0.8 mg via SUBLINGUAL

## 2023-06-20 ENCOUNTER — Telehealth: Payer: Self-pay | Admitting: Cardiovascular Disease

## 2023-06-20 NOTE — Telephone Encounter (Signed)
Pt returning result call requesting cb

## 2023-06-20 NOTE — Telephone Encounter (Signed)
Returned call to pt. Gave CT result per Dr Marylene Buerger notation. Verbalized understanding. Reviewed ECHO appt date/time she states that she did not know that this was scheduled. She will arrive early for this appt.

## 2023-06-22 ENCOUNTER — Ambulatory Visit (HOSPITAL_COMMUNITY)
Admission: RE | Admit: 2023-06-22 | Discharge: 2023-06-22 | Disposition: A | Discharge status: Home / Self Care | Payer: Managed Care, Other (non HMO) | Source: Ambulatory Visit | Attending: Cardiology | Admitting: Cardiology

## 2023-06-22 DIAGNOSIS — R072 Precordial pain: Secondary | ICD-10-CM | POA: Diagnosis present

## 2023-06-22 LAB — ECHOCARDIOGRAM COMPLETE
AR max vel: 2.69 cm2
AV Area VTI: 2.67 cm2
AV Area mean vel: 2.5 cm2
AV Mean grad: 2 mm[Hg]
AV Peak grad: 3.5 mm[Hg]
Ao pk vel: 0.94 m/s
Area-P 1/2: 4.49 cm2
MV M vel: 0.76 m/s
MV Peak grad: 2.3 mm[Hg]
S' Lateral: 1.91 cm

## 2023-07-13 ENCOUNTER — Telehealth: Payer: Self-pay | Admitting: Cardiovascular Disease

## 2023-07-13 NOTE — Telephone Encounter (Signed)
 Sande Rives, MD 06/24/2023  4:32 PM EST     Please let her know that her ultrasound shows normal heart function.  She has mildly leaky heart valves which are fairly normal.  Everything looks reassuring on the study.  She does not have MyChart message.   Gerri Spore T. Flora Lipps, MD, [facility]  [facility] [address] [place], [place] [address] [phone] 4:32 PM    Patient identification verified by 2 forms. Marilynn Rail, RN    Called and spoke to patient  Relayed provider result message  Patient states:   -needs access to some recent records   -would like multiple records/results  RN provided medical record # to call and request records  Patient verbalized understanding, no questions at this time

## 2023-07-13 NOTE — Telephone Encounter (Signed)
 Pt returning call to a nurse for results

## 2023-08-06 ENCOUNTER — Ambulatory Visit (INDEPENDENT_AMBULATORY_CARE_PROVIDER_SITE_OTHER): Payer: Managed Care, Other (non HMO) | Admitting: Surgery

## 2023-08-06 ENCOUNTER — Encounter: Payer: Self-pay | Admitting: Surgery

## 2023-08-06 VITALS — BP 119/81 | HR 76 | Temp 97.9°F | Ht 64.0 in | Wt 155.0 lb

## 2023-08-06 DIAGNOSIS — I8393 Asymptomatic varicose veins of bilateral lower extremities: Secondary | ICD-10-CM

## 2023-08-06 NOTE — Progress Notes (Signed)
 Vascular and Vein Specialist of   Patient name: Lisa Mccarty MRN: [id number] DOB: Sep 13, 1960 Sex: female   REASON FOR VISIT:    Follow up  HISOTRY OF PRESENT ILLNESS:    Lisa Mccarty is a 63 y.o. female who is here today for follow-up of her varicose veins.  She was originally seen in [place] [facility] in November 2024.  She says that she has been dealing with lower extremity pain and stiffness since 2011.  Her pain is constant and located in thee in the distal upper leg and popliteal space as well as her calves.  The left leg is worse than the right it is worse after walking.  She has to sleep with hot water bottles around the back of her legs for pain relief.  She does complain of mild leg swelling she does have intermittent heaviness.  She describes sharp shooting pains going down her leg there is no history of DVT.  She tried to wear compression stockings but could not.   PAST MEDICAL HISTORY:   Past Medical History:  Diagnosis Date   Hyperlipidemia    Hypertension      FAMILY HISTORY:   Family History  Problem Relation Age of Onset   Heart disease Mother     SOCIAL HISTORY:   Social History   Tobacco Use   Smoking status: Never   Smokeless tobacco: Never  Substance Use Topics   Alcohol use: Not Currently     ALLERGIES:   No Known Allergies   CURRENT MEDICATIONS:   Current Outpatient Medications  Medication Sig Dispense Refill   amLODipine (NORVASC) 5 MG tablet TAKE 1 TABLET BY MOUTH EVERY DAY FOR 90 DAYS     FLUoxetine (PROZAC) 20 MG capsule Take 1 tablet by mouth daily.     traZODone (DESYREL) 100 MG tablet TAKE 1 TABLET BY MOUTH EVERYDAY AT BEDTIME     Fluoxetine HCl, PMDD, 10 MG TABS  (Patient not taking: Reported on 08/06/2023)     metoprolol tartrate (LOPRESSOR) 100 MG tablet Take 1 tablet (100 mg total) by mouth once for 1 dose. Take 2 hrs prior to cardiac testing 1 tablet 0   No current  facility-administered medications for this visit.    REVIEW OF SYSTEMS:   [X]  denotes positive finding, [ ]  denotes negative finding Cardiac  Comments:  Chest pain or chest pressure:    Shortness of breath upon exertion:    Short of breath when lying flat:    Irregular heart rhythm:        Vascular    Pain in calf, thigh, or hip brought on by ambulation:    Pain in feet at night that wakes you up from your sleep:     Blood clot in your veins:    Leg swelling:         Pulmonary    Oxygen at home:    Productive cough:     Wheezing:         Neurologic    Sudden weakness in arms or legs:     Sudden numbness in arms or legs:     Sudden onset of difficulty speaking or slurred speech:    Temporary loss of vision in one eye:     Problems with dizziness:         Gastrointestinal    Blood in stool:     Vomited blood:         Genitourinary    Burning when urinating:  Blood in urine:        Psychiatric    Major depression:         Hematologic    Bleeding problems:    Problems with blood clotting too easily:        Skin    Rashes or ulcers:        Constitutional    Fever or chills:      PHYSICAL EXAM:   Vitals:   08/06/23 0921  BP: 119/81  Pulse: 76  Temp: 97.9 F (36.6 C)  Weight: 155 lb (70.3 kg)  Height: 5\' 4"  (1.626 m)    GENERAL: The patient is a well-nourished female, in no acute distress. The vital signs are documented above. CARDIAC: There is a regular rate and rhythm.  VASCULAR: SonoSite was used to evaluate her saphenous vein which was very small less than 2 mm.  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  MUSCULOSKELETAL: There are no major deformities or cyanosis. NEUROLOGIC: No focal weakness or paresthesias are detected. SKIN: See photo below  PSYCHIATRIC: The patient has a normal affect.  STUDIES:   I have reviewed the  following ultrasound: +--------------+---------+------+-----------+------------+--------+  LEFT         Reflux NoRefluxReflux TimeDiameter cmsComments                          Yes                                   +--------------+---------+------+-----------+------------+--------+  CFV                    yes   >1 second                       +--------------+---------+------+-----------+------------+--------+  FV mid        no                                              +--------------+---------+------+-----------+------------+--------+  Popliteal    no                                              +--------------+---------+------+-----------+------------+--------+  GSV at [facility]    no                            0.88              +--------------+---------+------+-----------+------------+--------+  GSV prox thighno                            0.41              +--------------+---------+------+-----------+------------+--------+  GSV mid thigh           yes    >500 ms      0.29              +--------------+---------+------+-----------+------------+--------+  GSV dist thigh          yes    >  500 ms      0.26              +--------------+---------+------+-----------+------------+--------+  GSV at knee   no                            0.23              +--------------+---------+------+-----------+------------+--------+  GSV prox calf           yes    >500 ms      0.19              +--------------+---------+------+-----------+------------+--------+  GSV mid calf  no                            0.18              +--------------+---------+------+-----------+------------+--------+  SSV Pop Fossa no                            0.16              +--------------+---------+------+-----------+------------+--------+  SSV prox calf no                            0.16               +--------------+---------+------+-----------+------------+--------+  SSV mid calf  no                            0.12              +--------------+---------+------+-----------+------------+--------+    MEDICAL ISSUES:   Varicose veins: I discussed with the patient that I do not think that she has significant axial reflux to justify laser ablation.  I offered her sclerotherapy to address her reticular veins, however I told her that I do not think that this would have any impact on her current symptoms which are leg heaviness that comes and goes on both legs as well as sharp shooting pains that go down the back of her leg and around her knee.  I do not think venous issues are her main problem.  I think that she should explore treatment for neuropathy.  I told her to have these conversations with her primary medical    Charlena Cross, MD, FACS [facility] of [facility] [phone] Pager [phone]

## 2024-03-17 ENCOUNTER — Encounter: Payer: Self-pay | Admitting: Radiology
